# Patient Record
Sex: Female | Born: 1960 | Race: White | Hispanic: No | Marital: Married | Smoking: Former smoker
Health system: Southern US, Community
[De-identification: ages and names within clinical notes are randomized; demographics above are authoritative.]

## PROBLEM LIST (undated history)

## (undated) DIAGNOSIS — R2242 Localized swelling, mass and lump, left lower limb: Secondary | ICD-10-CM

## (undated) DIAGNOSIS — Z800 Family history of malignant neoplasm of digestive organs: Secondary | ICD-10-CM

## (undated) DIAGNOSIS — M1990 Unspecified osteoarthritis, unspecified site: Secondary | ICD-10-CM

## (undated) DIAGNOSIS — N3281 Overactive bladder: Secondary | ICD-10-CM

## (undated) HISTORY — PX: BUNIONECTOMY: SHX129

## (undated) HISTORY — PX: FOOT SURGERY: SHX648

## (undated) HISTORY — DX: Family history of malignant neoplasm of digestive organs: Z80.0

## (undated) HISTORY — PX: APPENDECTOMY: SHX54

## (undated) HISTORY — PX: CHOLECYSTECTOMY: SHX55

## (undated) HISTORY — DX: Unspecified osteoarthritis, unspecified site: M19.90

## (undated) HISTORY — PX: BREAST REDUCTION SURGERY: SHX8

## (undated) HISTORY — PX: ABDOMINAL HYSTERECTOMY: SHX81

---

## 2002-02-05 ENCOUNTER — Ambulatory Visit (HOSPITAL_BASED_OUTPATIENT_CLINIC_OR_DEPARTMENT_OTHER): Admission: RE | Admit: 2002-02-05 | Discharge: 2002-02-06 | Payer: Self-pay | Admitting: Specialist

## 2005-05-27 ENCOUNTER — Ambulatory Visit (HOSPITAL_COMMUNITY): Admission: RE | Admit: 2005-05-27 | Discharge: 2005-05-27 | Payer: Self-pay | Admitting: Family Medicine

## 2005-06-03 ENCOUNTER — Ambulatory Visit: Payer: Self-pay | Admitting: Internal Medicine

## 2005-06-04 ENCOUNTER — Ambulatory Visit (HOSPITAL_COMMUNITY): Admission: RE | Admit: 2005-06-04 | Discharge: 2005-06-04 | Payer: Self-pay | Admitting: Internal Medicine

## 2005-06-08 ENCOUNTER — Emergency Department (HOSPITAL_COMMUNITY): Admission: EM | Admit: 2005-06-08 | Discharge: 2005-06-08 | Payer: Self-pay | Admitting: Emergency Medicine

## 2005-06-10 ENCOUNTER — Ambulatory Visit (HOSPITAL_COMMUNITY): Admission: RE | Admit: 2005-06-10 | Discharge: 2005-06-10 | Payer: Self-pay | Admitting: Internal Medicine

## 2005-06-10 ENCOUNTER — Ambulatory Visit: Payer: Self-pay | Admitting: Internal Medicine

## 2005-06-11 ENCOUNTER — Encounter (HOSPITAL_COMMUNITY): Admission: RE | Admit: 2005-06-11 | Discharge: 2005-06-11 | Payer: Self-pay | Admitting: Internal Medicine

## 2005-07-05 ENCOUNTER — Ambulatory Visit (HOSPITAL_COMMUNITY): Admission: RE | Admit: 2005-07-05 | Discharge: 2005-07-05 | Payer: Self-pay | Admitting: General Surgery

## 2006-04-21 ENCOUNTER — Ambulatory Visit: Payer: Self-pay | Admitting: Internal Medicine

## 2006-04-25 ENCOUNTER — Ambulatory Visit (HOSPITAL_COMMUNITY): Admission: RE | Admit: 2006-04-25 | Discharge: 2006-04-25 | Payer: Self-pay | Admitting: Internal Medicine

## 2006-04-25 ENCOUNTER — Ambulatory Visit: Payer: Self-pay | Admitting: Internal Medicine

## 2009-06-28 HISTORY — PX: OOPHORECTOMY: SHX86

## 2011-02-11 ENCOUNTER — Ambulatory Visit (INDEPENDENT_AMBULATORY_CARE_PROVIDER_SITE_OTHER): Payer: 59 | Admitting: Internal Medicine

## 2011-02-11 ENCOUNTER — Encounter (INDEPENDENT_AMBULATORY_CARE_PROVIDER_SITE_OTHER): Payer: Self-pay | Admitting: Internal Medicine

## 2011-02-11 VITALS — BP 110/70 | HR 80 | Temp 97.8°F | Ht 63.0 in | Wt 190.9 lb

## 2011-02-11 DIAGNOSIS — K625 Hemorrhage of anus and rectum: Secondary | ICD-10-CM

## 2011-02-11 MED ORDER — HYDROCORTISONE ACETATE 25 MG RE SUPP: 25.0000 mg | Freq: Two times a day (BID) | RECTAL | Status: AC

## 2011-02-11 NOTE — Progress Notes (Signed)
Subjective:     Patient ID: Eileen Arnold, female   DOB: 1961-01-14, 50 y.o.   MRN: [id number]  HPI  Eileen Arnold is a 50 yr old female presenting today with c/o rectal bleeding. Started last Wednesday. She was hurting left lower quadrant.  She says it felt like it was her cyst on her ovary.  She bled for 4 days in very small amounts.  She says it was bright red.  Everytime she had a BM she would see bright red blood. Blood was on the stool. Stools are not firm.  They are not pencil thin. No rectal bleeding since Sunday. She underwent a colonoscopy with polypectomy in May of 2011.Biopsy: Hyperplastic polyp. No evidence of endoscopic colitis in random biopsies taken from sigmoid colon.looking for microscopic colitis. Two polyps snared. Will find path report.  Appetite remains good. No weight loss.  No abdominal pain. No recent bright red rectal bleeding.  There is a family history of colon cancer in a first degree relative.  Review of Systems     Objective:   Physical Exam Alert and oriented. Skin warm and dry. Oral mucosa is moist. Natural teeth in good condition. Sclera anicteric, conjunctivae is pink. Thyroid not enlarged. No cervical lymphadenopathy. Lungs clear. Heart regular rate and rhythm.  Abdomen is soft. Bowel sounds are positive. No hepatomegaly. No abdominal masses felt. No tenderness. Stool brown and guaiac negative..  No edema to lower extremities. Patient is alert and oriented.     Assessment:    Rectal bleeding. Family history of colon cancer. Rectal bleeding has resolved at this time.  I discussed this case with Dr. Karilyn Cota.  His recommendations as follows under plan and I agree.    Plan:     Anusol supp x 6 days. OV in 1 month. If any problems she will call office. Patient was reassured.

## 2011-03-18 ENCOUNTER — Ambulatory Visit (INDEPENDENT_AMBULATORY_CARE_PROVIDER_SITE_OTHER): Payer: 59 | Admitting: Internal Medicine

## 2011-06-02 ENCOUNTER — Encounter (INDEPENDENT_AMBULATORY_CARE_PROVIDER_SITE_OTHER): Payer: Self-pay | Admitting: *Deleted

## 2013-03-29 ENCOUNTER — Ambulatory Visit (INDEPENDENT_AMBULATORY_CARE_PROVIDER_SITE_OTHER): Payer: 59 | Admitting: Nurse Practitioner

## 2013-03-29 ENCOUNTER — Encounter: Payer: Self-pay | Admitting: Nurse Practitioner

## 2013-03-29 VITALS — BP 145/85 | HR 85 | Temp 97.0°F | Ht 63.0 in | Wt 238.0 lb

## 2013-03-29 DIAGNOSIS — M79609 Pain in unspecified limb: Secondary | ICD-10-CM

## 2013-03-29 DIAGNOSIS — Z23 Encounter for immunization: Secondary | ICD-10-CM

## 2013-03-29 DIAGNOSIS — M79604 Pain in right leg: Secondary | ICD-10-CM

## 2013-03-29 MED ORDER — NAPROXEN 500 MG PO TABS: 500.0000 mg | ORAL_TABLET | Freq: Two times a day (BID) | ORAL | Status: DC

## 2013-03-29 NOTE — Progress Notes (Signed)
  Subjective:    Patient ID: Eileen Arnold, female    DOB: [birth year], 52 y.o.   MRN: [id number]  HPI Patient in c/o right leg pain- hurts on the lateral side of lower leg- is worse at night when she is laying down. Denies injury. No edema- no pain with walking. Ibuprofen helps some at bedtime- she says that the pain is a throbbing type of pain.    Review of Systems  All other systems reviewed and are negative.       Objective:   Physical Exam  Constitutional: She is oriented to person, place, and time. She appears well-developed and well-nourished.  Cardiovascular: Normal rate, regular rhythm, normal heart sounds and intact distal pulses.   Pulmonary/Chest: Effort normal and breath sounds normal.  Musculoskeletal:  No lower ext edema No point tenderness along right lateral lower ext Neg homan sign Palpable pedal pulses 2+ bil  Neurological: She is alert and oriented to person, place, and time.    BP 145/85  Pulse 85  Temp(Src) 97 F (36.1 C) (Oral)  Ht 5\' 3"  (1.6 m)  Wt 238 lb (107.956 kg)  BMI 42.17 kg/m2       Assessment & Plan:   1. Lower extremity pain, lateral, right    Meds ordered this encounter  Medications  . naproxen (NAPROSYN) 500 MG tablet    Sig: Take 1 tablet (500 mg total) by mouth 2 (two) times daily with a meal.    Dispense:  60 tablet    Refill:  1    Order Specific Question:  Supervising Provider    Answer:  Ernestina Penna [[id number]]   Elevate leg when sitting Keeps legs really warm at night Labs pending Take meds with food If no better in 1 week may do doppler study- although I really don't feel this is clot

## 2013-03-29 NOTE — Patient Instructions (Addendum)
Deep Vein Thrombosis A deep vein thrombosis (DVT) is a blood clot that develops in a deep vein. A DVT is a clot in the deep, larger veins of the leg, arm, or pelvis. These are more dangerous than clots that might form in veins near the surface of the body. A DVT can lead to complications if the clot breaks off and travels in the bloodstream to the lungs.  A DVT can damage the valves in your leg veins, so that instead of flowing upwards, the blood pools in the lower leg. This is called post-thrombotic syndrome, and can result in pain, swelling, discoloration, and sores on the leg. Once identified, a DVT can be treated. It can also be prevented in some circumstances. Once you have had a DVT, you may be at increased risk for a DVT in the future. CAUSES Blood clots form in a vein for different reasons. Usually several things contribute to blood clots. Contributing factors include:  The flow of blood slows down.  The inside of the vein is damaged in some way.  The person has a condition that makes blood clot more easily. Some people are more likely than others to develop blood clots. That is because they have more factors that make clots likely. These are called risk factors. Risk factors include:   Older age, especially over 75 years old.  Having a history of blood clots. This means you have had one before. Or, it means that someone else in your family has had blood clots. You may have a genetic tendency to form clots.  Having major or lengthy surgery. This is especially true for surgery on the hip, knee, or belly (abdomen). Hip surgery is particularly high risk.  Breaking a hip or leg.  Sitting or lying still for a long time. This includes long distance travel, paralysis, or recovery from an illness or surgery.  Cancer, or cancer treatment.  Having a long, thin tube (catheter) placed inside a vein during a medical procedure.  Being overweight (obese).  Pregnancy and childbirth. Hormone  changes make the blood clot more easily during pregnancy. The fetus puts pressure on the veins of the pelvis. There is also risk of injury to veins during delivery or a caesarean. The risk is at its highest just after childbirth.  Medicines with the female hormone estrogen. This includes birth control pills and hormone replacement therapy.  Smoking.  Other circulation or heart problems. SYMPTOMS When a clot forms, it can either partially or totally block the blood flow in that vein. Symptoms of a DVT can include:  Swelling of the leg or arm, especially if one side is much worse.  Warmth and redness of the leg or arm, especially if one side is much worse.  Pain in an arm or leg. If the clot is in the leg, symptoms may be more noticeable or worse when standing or walking. The symptoms of a DVT that has traveled to the lungs (pulmonary embolism, PE) usually start suddenly, and include:  Shortness of breath.  Coughing.  Coughing up blood or blood-tinged phlegm.  Chest pain. The chest pain is often worse with deep breaths.  Rapid heartbeat. Anyone with these symptoms should get emergency medical treatment right away. Call your local emergency services (911 in [place] if you have these symptoms. DIAGNOSIS If a DVT is suspected, your caregiver will take a full medical history and carry out a physical exam. Tests that also may be required include:  Blood tests, including studies of   the clotting properties of the blood.  Ultrasonography to see if you have clots in your legs or lungs.  X-rays to show the flow of blood when dye is injected into the veins (venography).  Studies of your lungs, if you have any chest symptoms. PREVENTION  Exercise the legs regularly. Take a brisk 30 minute walk every day.  Maintain a weight that is appropriate for your height.  Avoid sitting or lying in bed for long periods of time without moving your legs.  Women, particularly those over the age of 35,  should consider the risks and benefits of taking estrogen medicines, including birth control pills.  Do not smoke, especially if you take estrogen medicines.  Long distance travel can increase your risk of DVT. You should exercise your legs by walking or pumping the muscles every hour.  In-hospital prevention:  Many of the risk factors above relate to situations that exist with hospitalization, either for illness, injury, or elective surgery.  Your caregiver will assess you for the need for venous thromboembolism prophylaxis when you are admitted to the hospital. If you are having surgery, your surgeon will assess you the day of or day after surgery.  Prevention may include medical and nonmedical measures. TREATMENT Treatment for DVT helps prevent death and disability. The most common treatment for DVT is blood thinning (anticoagulant) medicine, which reduces the blood's tendency to clot. Anticoagulants can stop new blood clots from forming and old ones from growing. They cannot dissolve existing clots. Your body does this by itself over time. Anticoagulants can be given by mouth, by intravenous (IV) access, or by injection. Your caregiver will determine the best program for you.  Heparin or related medicines (low molecular weight heparin) are usually the first treatment for a blood clot. They act quickly. However, they cannot be taken orally.  Heparin can cause a fall in a component of blood that stops bleeding and forms blood clots (platelets). You will be monitored with blood tests to be sure this does not occur.  Warfarin is an anticoagulant that can be swallowed (taken orally). It takes a few days to start working, so usually heparin or related medicines are used in combination. Once warfarin is working, heparin is usually stopped.  Less commonly, clot dissolving drugs (thrombolytics) are used to dissolve a DVT. They carry a high risk of bleeding, so they are used mainly in severe cases,  where a life or limb is threatened.  Very rarely, a blood clot in the leg needs to be removed surgically.  If you are unable to take anticoagulants, your caregiver may arrange for you to have a filter placed in a main vein in your belly (abdomen). This filter prevents clots from traveling to your lungs. HOME CARE INSTRUCTIONS  Take all medicines prescribed by your caregiver. Follow the directions carefully.  Warfarin. Most people will continue taking warfarin after hospital discharge. Your caregiver will advise you on the length of treatment (usually 3 6 months, sometimes lifelong).  Too much and too little warfarin are both dangerous. Too much warfarin increases the risk of bleeding. Too little warfarin continues to allow the risk for blood clots. While taking warfarin, you will need to have regular blood tests to measure your blood clotting time. These blood tests usually include both the prothrombin time (PT) and international normalized ratio (INR) tests. The PT and INR results allow your caregiver to adjust your dose of warfarin. The dose can change for many reasons. It is critically important that   you take warfarin exactly as prescribed, and that you have your PT and INR levels drawn exactly as directed.  Many foods, especially foods high in vitamin K can interfere with warfarin and affect the PT and INR results. Foods high in vitamin K include spinach, kale, broccoli, cabbage, collard and turnip greens, brussels sprouts, peas, cauliflower, seaweed, and parsley as well as beef and pork liver, green tea, and soybean oil. You should eat a consistent amount of foods high in vitamin K. Avoid major changes in your diet, or notify your caregiver before changing your diet. Arrange a visit with a dietitian to answer your questions.  Many medicines can interfere with warfarin and affect the PT and INR results. You must tell your caregiver about any and all medicines you take, this includes all vitamins  and supplements. Be especially cautious with aspirin and anti-inflammatory medicines. Ask your caregiver before taking these. Do not take or discontinue any prescribed or over-the-counter medicine except on the advice of your caregiver or pharmacist.  Warfarin can have side effects, primarily excessive bruising or bleeding. You will need to hold pressure over cuts for longer than usual. Your caregiver or pharmacist will discuss other potential side effects.  Alcohol can change the body's ability to handle warfarin. It is best to avoid alcoholic drinks or consume only very small amounts while taking warfarin. Notify your caregiver if you change your alcohol intake.  Notify your dentist or other caregivers before procedures.  Activity. Ask your caregiver how soon you can go back to normal activities. It is important to stay active to prevent blood clots. If you are on anticoagulant medicine, avoid contact sports.  Exercise. It is very important to exercise. This is especially important while traveling, sitting or standing for long periods of time. Exercise your legs by walking or by pumping the muscles frequently. Take frequent walks.  Compression stockings. These are tight elastic stockings that apply pressure to the lower legs. This pressure can help keep the blood in the legs from clotting. You may need to wear compressions stockings at home to help prevent a DVT.  Smoking. If you smoke, quit. Ask your caregiver for help with quitting smoking.  Learn as much as you can about DVT. Knowing more about the condition should help you keep it from coming back.  Wear a medical alert bracelet or carry a medical alert card. SEEK MEDICAL CARE IF:  You notice a rapid heartbeat.  You feel weaker or more tired than usual.  You feel faint.  You notice increased bruising.  You feel your symptoms are not getting better in the time expected.  You believe you are having side effects of medicine. SEEK  IMMEDIATE MEDICAL CARE IF:  You have chest pain.  You have trouble breathing.  You have new or increased swelling or pain in one leg.  You cough up blood.  You notice blood in vomit, in a bowel movement, or in urine. MAKE SURE YOU:  Understand these instructions.  Will watch your condition.  Will get help right away if you are not doing well or get worse. Document Released: 06/14/2005 Document Revised: 03/08/2012 Document Reviewed: 08/06/2010 [employer] Patient Information 2014 [employer].  

## 2013-03-30 LAB — CMP14+EGFR
ALT: 19 IU/L (ref 0–32)
AST: 21 IU/L (ref 0–40)
Albumin/Globulin Ratio: 2 (ref 1.1–2.5)
Albumin: 4.2 g/dL (ref 3.5–5.5)
BUN/Creatinine Ratio: 19 (ref 9–23)
BUN: 15 mg/dL (ref 6–24)
CO2: 29 mmol/L (ref 18–29)
Calcium: 9.7 mg/dL (ref 8.7–10.2)
Chloride: 101 mmol/L (ref 97–108)
Creatinine, Ser: 0.77 mg/dL (ref 0.57–1.00)
GFR calc Af Amer: 103 mL/min/{1.73_m2} (ref >59)
GFR calc non Af Amer: 90 mL/min/{1.73_m2} (ref >59)
Globulin, Total: 2.1 g/dL (ref 1.5–4.5)
Glucose: 96 mg/dL (ref 65–99)
Potassium: 4.7 mmol/L (ref 3.5–5.2)
Sodium: 144 mmol/L (ref 134–144)
Total Bilirubin: 0.3 mg/dL (ref 0.0–1.2)
Total Protein: 6.3 g/dL (ref 6.0–8.5)

## 2013-04-16 ENCOUNTER — Ambulatory Visit (INDEPENDENT_AMBULATORY_CARE_PROVIDER_SITE_OTHER): Payer: 59

## 2013-04-16 ENCOUNTER — Ambulatory Visit (INDEPENDENT_AMBULATORY_CARE_PROVIDER_SITE_OTHER): Payer: 59 | Admitting: Nurse Practitioner

## 2013-04-16 ENCOUNTER — Encounter: Payer: Self-pay | Admitting: Nurse Practitioner

## 2013-04-16 DIAGNOSIS — M79609 Pain in unspecified limb: Secondary | ICD-10-CM

## 2013-04-16 DIAGNOSIS — M549 Dorsalgia, unspecified: Secondary | ICD-10-CM

## 2013-04-16 MED ORDER — PREDNISONE 20 MG PO TABS: ORAL_TABLET | ORAL | Status: DC

## 2013-04-16 MED ORDER — METHYLPREDNISOLONE ACETATE 80 MG/ML IJ SUSP
80.0000 mg | Freq: Once | INTRAMUSCULAR | Status: AC
  Administered 2013-04-16: 80 mg via INTRAMUSCULAR

## 2013-04-16 NOTE — Patient Instructions (Signed)
Sciatica °Sciatica is pain, weakness, numbness, or tingling along the path of the sciatic nerve. The nerve starts in the lower back and runs down the back of each leg. The nerve controls the muscles in the lower leg and in the back of the knee, while also providing sensation to the back of the thigh, lower leg, and the sole of your foot. Sciatica is a symptom of another medical condition. For instance, nerve damage or certain conditions, such as a herniated disk or bone spur on the spine, pinch or put pressure on the sciatic nerve. This causes the pain, weakness, or other sensations normally associated with sciatica. Generally, sciatica only affects one side of the body. °CAUSES  °· Herniated or slipped disc. °· Degenerative disk disease. °· A pain disorder involving the narrow muscle in the buttocks (piriformis syndrome). °· Pelvic injury or fracture. °· Pregnancy. °· Tumor (rare). °SYMPTOMS  °Symptoms can vary from mild to very severe. The symptoms usually travel from the low back to the buttocks and down the back of the leg. Symptoms can include: °· Mild tingling or dull aches in the lower back, leg, or hip. °· Numbness in the back of the calf or sole of the foot. °· Burning sensations in the lower back, leg, or hip. °· Sharp pains in the lower back, leg, or hip. °· Leg weakness. °· Severe back pain inhibiting movement. °These symptoms may get worse with coughing, sneezing, laughing, or prolonged sitting or standing. Also, being overweight may worsen symptoms. °DIAGNOSIS  °Your caregiver will perform a physical exam to look for common symptoms of sciatica. He or she may ask you to do certain movements or activities that would trigger sciatic nerve pain. Other tests may be performed to find the cause of the sciatica. These may include: °· Blood tests. °· X-rays. °· Imaging tests, such as an MRI or CT scan. °TREATMENT  °Treatment is directed at the cause of the sciatic pain. Sometimes, treatment is not necessary  and the pain and discomfort goes away on its own. If treatment is needed, your caregiver may suggest: °· Over-the-counter medicines to relieve pain. °· Prescription medicines, such as anti-inflammatory medicine, muscle relaxants, or narcotics. °· Applying heat or ice to the painful area. °· Steroid injections to lessen pain, irritation, and inflammation around the nerve. °· Reducing activity during periods of pain. °· Exercising and stretching to strengthen your abdomen and improve flexibility of your spine. Your caregiver may suggest losing weight if the extra weight makes the back pain worse. °· Physical therapy. °· Surgery to eliminate what is pressing or pinching the nerve, such as a bone spur or part of a herniated disk. °HOME CARE INSTRUCTIONS  °· Only take over-the-counter or prescription medicines for pain or discomfort as directed by your caregiver. °· Apply ice to the affected area for 20 minutes, 3 4 times a day for the first 48 72 hours. Then try heat in the same way. °· Exercise, stretch, or perform your usual activities if these do not aggravate your pain. °· Attend physical therapy sessions as directed by your caregiver. °· Keep all follow-up appointments as directed by your caregiver. °· Do not wear high heels or shoes that do not provide proper support. °· Check your mattress to see if it is too soft. A firm mattress may lessen your pain and discomfort. °SEEK IMMEDIATE MEDICAL CARE IF:  °· You lose control of your bowel or bladder (incontinence). °· You have increasing weakness in the lower back,   pelvis, buttocks, or legs. °· You have redness or swelling of your back. °· You have a burning sensation when you urinate. °· You have pain that gets worse when you lie down or awakens you at night. °· Your pain is worse than you have experienced in the past. °· Your pain is lasting longer than 4 weeks. °· You are suddenly losing weight without reason. °MAKE SURE YOU: °· Understand these  instructions. °· Will watch your condition. °· Will get help right away if you are not doing well or get worse. °Document Released: 06/08/2001 Document Revised: 12/14/2011 Document Reviewed: 10/24/2011 °[employer]® Patient Information ©2014 [employer]. ° °

## 2013-04-16 NOTE — Progress Notes (Signed)
  Subjective:    Patient ID: Eileen Arnold, female    DOB: Jan 21, 1961, 52 y.o.   MRN: [id number]  HPI  Patient in C/o right leg pain- started several months ago an d is getting worse- Pain starts in right anterior lower leg and radiates upward- Hurts worse at night- no pain with walking    Review of Systems  All other systems reviewed and are negative.       Objective:   Physical Exam  Constitutional: She appears well-developed and well-nourished.  Cardiovascular: Normal rate, regular rhythm and normal heart sounds.   Pulmonary/Chest: Effort normal and breath sounds normal.  Musculoskeletal:  Pain on palpation right lateral shin- no calf swelling on tenderness   Right lower leg x ray- negative-Preliminary reading by Paulene Floor, FNP  [facility] BP 144/83  Pulse 97  Temp(Src) 97.4 F (36.3 C) (Oral)  Ht 5\' 3"  (1.6 m)  Wt 244 lb (110.678 kg)  BMI 43.23 kg/m2         Assessment & Plan:   1. Lower leg pain, right   2. Back pain    Meds ordered this encounter  Medications  . methylPREDNISolone acetate (DEPO-MEDROL) injection 80 mg    Sig:   . predniSONE (DELTASONE) 20 MG tablet    Sig: 2 tablets at same time daily for 5 days- start tomorrow    Dispense:  10 tablet    Refill:  0    Order Specific Question:  Supervising Provider    Answer:  Ernestina Penna [[id number]]   Moist heat to back- No heavy lifting RTO if not improving and we will order MRI  Mary-Margaret Daphine Deutscher, FNP

## 2013-04-23 ENCOUNTER — Other Ambulatory Visit: Payer: Self-pay | Admitting: Nurse Practitioner

## 2013-04-23 ENCOUNTER — Telehealth: Payer: Self-pay

## 2013-04-23 DIAGNOSIS — M545 Low back pain: Secondary | ICD-10-CM

## 2013-04-23 NOTE — Telephone Encounter (Signed)
Patient aware referrla made for MRI

## 2013-04-23 NOTE — Telephone Encounter (Signed)
Pt called stating she had completed medicine with no change in pain level. Pain radiating down right leg; worse at night when trying to rest. Can we schedule MRI? Pt's insurance does not require Precertification. She wants this done at [facility]

## 2013-05-02 ENCOUNTER — Other Ambulatory Visit: Payer: Self-pay | Admitting: Nurse Practitioner

## 2013-05-02 DIAGNOSIS — M48061 Spinal stenosis, lumbar region without neurogenic claudication: Secondary | ICD-10-CM

## 2014-01-01 ENCOUNTER — Telehealth: Payer: Self-pay

## 2014-01-01 NOTE — Telephone Encounter (Signed)
Patient has not been seen since last October- ins will not approve MRI without being seen.

## 2014-01-02 NOTE — Telephone Encounter (Signed)
Patient aware appointment made 7/9 per mmm

## 2014-01-03 ENCOUNTER — Ambulatory Visit (INDEPENDENT_AMBULATORY_CARE_PROVIDER_SITE_OTHER): Payer: 59 | Admitting: Nurse Practitioner

## 2014-01-03 ENCOUNTER — Encounter: Payer: Self-pay | Admitting: Nurse Practitioner

## 2014-01-03 ENCOUNTER — Ambulatory Visit (INDEPENDENT_AMBULATORY_CARE_PROVIDER_SITE_OTHER): Payer: 59

## 2014-01-03 VITALS — BP 150/70 | HR 99 | Temp 98.3°F | Ht 63.0 in | Wt 251.2 lb

## 2014-01-03 DIAGNOSIS — M25569 Pain in unspecified knee: Secondary | ICD-10-CM

## 2014-01-03 DIAGNOSIS — M25561 Pain in right knee: Secondary | ICD-10-CM

## 2014-01-03 MED ORDER — KETOROLAC TROMETHAMINE 60 MG/2ML IM SOLN
60.0000 mg | Freq: Once | INTRAMUSCULAR | Status: AC
Start: 2014-01-03 — End: 2014-01-03
  Administered 2014-01-03: 60 mg via INTRAMUSCULAR

## 2014-01-03 MED ORDER — CELECOXIB 200 MG PO CAPS: 200.0000 mg | ORAL_CAPSULE | Freq: Two times a day (BID) | ORAL | Status: DC

## 2014-01-03 NOTE — Patient Instructions (Signed)

## 2014-01-03 NOTE — Progress Notes (Signed)
   Subjective:    Patient ID: Eileen Arnold, female    DOB: [birth year], 53 y.o.   MRN: [id number]  Knee Pain  The incident occurred more than 1 week ago (patient is a [employer]. ). There was no injury mechanism. The pain is present in the right leg. The quality of the pain is described as aching. The pain is at a severity of 6/10. The pain is moderate. The pain has been constant since onset. Associated symptoms include numbness (ankle. ). Pertinent negatives include no inability to bear weight or tingling. She reports no foreign bodies present. The symptoms are aggravated by movement. She has tried NSAIDs for the symptoms. The treatment provided no relief.      Review of Systems  Neurological: Positive for numbness (ankle. ). Negative for tingling.  All other systems reviewed and are negative.      Objective:   Physical Exam  Constitutional: She is oriented to person, place, and time. She appears well-developed and well-nourished.  HENT:  Head: Normocephalic.  Right Ear: Hearing, tympanic membrane, external ear and ear canal normal.  Left Ear: Hearing, tympanic membrane, external ear and ear canal normal.  Nose: Nose normal.  Mouth/Throat: Uvula is midline, oropharynx is clear and moist and mucous membranes are normal.  Eyes: Conjunctivae and EOM are normal. Pupils are equal, round, and reactive to light.  Neck: Normal range of motion. Neck supple. No JVD present. No thyromegaly present.  Cardiovascular: Normal rate, normal heart sounds and intact distal pulses.   No murmur heard. Pulmonary/Chest: Effort normal and breath sounds normal. She has no wheezes. She has no rales.  Abdominal: Soft. Bowel sounds are normal. She exhibits no mass.  Musculoskeletal: She exhibits tenderness.  Pain with internal rotation, right knee.   Neurological: She is alert and oriented to person, place, and time. She has normal reflexes.  Skin: Skin is warm and dry.  Psychiatric: She has a normal mood  and affect. Her behavior is normal. Judgment and thought content normal.    Right knee xray- wnl- bakers cyst posterior knee-Preliminary reading by Ronnald Collum, FNP  [facility]       Assessment & Plan:   1. Right knee pain    Orders Placed This Encounter  Procedures  . DG Knee 1-2 Views Right    Standing Status: Future     Number of Occurrences: 1     Standing Expiration Date: 03/05/2015    Order Specific Question:  Reason for Exam (SYMPTOM  OR DIAGNOSIS REQUIRED)    Answer:  right knee pain.    Order Specific Question:  Is the patient pregnant?    Answer:  No    Order Specific Question:  Preferred imaging location?    Answer:  Internal   Meds ordered this encounter  Medications  . Fish Oil-Cholecalciferol (FISH OIL + D3 PO)    Sig: Take by mouth.  . Cetirizine HCl (ZYRTEC PO)    Sig: Take by mouth. OTC  . celecoxib (CELEBREX) 200 MG capsule    Sig: Take 1 capsule (200 mg total) by mouth 2 (two) times daily.    Dispense:  60 capsule    Refill:  2    Order Specific Question:  Supervising Provider    Answer:  Chipper Herb [[id number]]  . ketorolac (TORADOL) injection 60 mg    Sig:    Moist heat Rest Elastic bandage  Mary-Margaret Hassell Done, FNP

## 2014-04-01 ENCOUNTER — Other Ambulatory Visit: Payer: Self-pay | Admitting: Nurse Practitioner

## 2014-04-04 ENCOUNTER — Ambulatory Visit (INDEPENDENT_AMBULATORY_CARE_PROVIDER_SITE_OTHER): Payer: 59

## 2014-04-04 DIAGNOSIS — Z23 Encounter for immunization: Secondary | ICD-10-CM

## 2014-05-05 ENCOUNTER — Other Ambulatory Visit: Payer: Self-pay | Admitting: Nurse Practitioner

## 2014-05-06 NOTE — Telephone Encounter (Signed)
Last ov 7/15. 

## 2014-06-02 ENCOUNTER — Other Ambulatory Visit: Payer: Self-pay | Admitting: Nurse Practitioner

## 2014-06-18 ENCOUNTER — Ambulatory Visit (INDEPENDENT_AMBULATORY_CARE_PROVIDER_SITE_OTHER): Payer: 59 | Admitting: Nurse Practitioner

## 2014-06-18 ENCOUNTER — Encounter: Payer: Self-pay | Admitting: Nurse Practitioner

## 2014-06-18 VITALS — BP 146/90 | HR 70 | Temp 97.0°F | Ht 63.0 in | Wt 257.0 lb

## 2014-06-18 DIAGNOSIS — J208 Acute bronchitis due to other specified organisms: Secondary | ICD-10-CM

## 2014-06-18 MED ORDER — METHYLPREDNISOLONE ACETATE 80 MG/ML IJ SUSP
80.0000 mg | Freq: Once | INTRAMUSCULAR | Status: AC
  Administered 2014-06-18: 80 mg via INTRAMUSCULAR

## 2014-06-18 MED ORDER — HYDROCODONE-HOMATROPINE 5-1.5 MG/5ML PO SYRP: 5.0000 mL | ORAL_SOLUTION | Freq: Three times a day (TID) | ORAL | Status: DC | PRN

## 2014-06-18 MED ORDER — PREDNISONE 20 MG PO TABS: ORAL_TABLET | ORAL | Status: DC

## 2014-06-18 NOTE — Progress Notes (Signed)
   Subjective:    Patient ID: Eileen Arnold, female    DOB: 21-Jul-1960, 53 y.o.   MRN: [id number]  HPI Patient in c/o of nasal congestion and cough that started Last Friday- OTC meds no help. Lost her voice last week but that has improved.    Review of Systems  Constitutional: Negative for fever, chills and appetite change.  HENT: Positive for congestion, rhinorrhea, sinus pressure and voice change. Negative for sore throat and trouble swallowing.   Respiratory: Positive for cough.   Cardiovascular: Negative.   Gastrointestinal: Negative.   Neurological: Negative.   Psychiatric/Behavioral: Negative.   All other systems reviewed and are negative.      Objective:   Physical Exam  Constitutional: She is oriented to person, place, and time. She appears well-developed and well-nourished. No distress.  HENT:  Right Ear: Hearing, tympanic membrane, external ear and ear canal normal.  Left Ear: Hearing, tympanic membrane, external ear and ear canal normal.  Nose: Mucosal edema and rhinorrhea present. Right sinus exhibits no maxillary sinus tenderness and no frontal sinus tenderness. Left sinus exhibits no maxillary sinus tenderness and no frontal sinus tenderness.  Mouth/Throat: Uvula is midline, oropharynx is clear and moist and mucous membranes are normal.  Eyes: Pupils are equal, round, and reactive to light.  Neck: Normal range of motion. Neck supple.  Cardiovascular: Normal rate, regular rhythm and normal heart sounds.   Pulmonary/Chest: Effort normal. She has no wheezes. She has no rales.  Deep dry cough  Abdominal: There is tenderness.  Lymphadenopathy:    She has no cervical adenopathy.  Neurological: She is alert and oriented to person, place, and time.  Skin: Skin is warm.  Psychiatric: She has a normal mood and affect. Her behavior is normal. Judgment and thought content normal.   BP 146/90 mmHg  Pulse 70  Temp(Src) 97 F (36.1 C) (Oral)  Ht 5\' 3"  (1.6 m)  Wt 257 lb  (116.574 kg)  BMI 45.54 kg/m2        Assessment & Plan:  1. Viral bronchitis 1. Take meds as prescribed 2. Use a cool mist humidifier especially during the winter months and when heat has been humid. 3. Use saline nose sprays frequently 4. Saline irrigations of the nose can be very helpful if done frequently.  * 4X daily for 1 week*  * Use of a nettie pot can be helpful with this. Follow directions with this* 5. Drink plenty of fluids 6. Keep thermostat turn down low 7.For any cough or congestion  Use plain Mucinex- regular strength or max strength is fine   * Children- consult with Pharmacist for dosing 8. For fever or aces or pains- take tylenol or ibuprofen appropriate for age and weight.  * for fevers greater than 101 orally you may alternate ibuprofen and tylenol every  3 hours.   - HYDROcodone-homatropine (HYCODAN) 5-1.5 MG/5ML syrup; Take 5 mLs by mouth every 8 (eight) hours as needed for cough.  Dispense: 120 mL; Refill: 0 - predniSONE (DELTASONE) 20 MG tablet; 2 Tablets PO at the same time daily for 5 days- do not start until tomorrow  Dispense: 10 tablet; Refill: 0 - methylPREDNISolone acetate (DEPO-MEDROL) injection 80 mg; Inject 1 mL (80 mg total) into the muscle once.  Mary-Margaret Hassell Done, FNP

## 2014-06-18 NOTE — Patient Instructions (Signed)

## 2014-06-24 ENCOUNTER — Telehealth: Payer: Self-pay

## 2014-06-24 ENCOUNTER — Other Ambulatory Visit: Payer: Self-pay | Admitting: Nurse Practitioner

## 2014-06-24 DIAGNOSIS — J208 Acute bronchitis due to other specified organisms: Secondary | ICD-10-CM

## 2014-06-24 MED ORDER — AZITHROMYCIN 250 MG PO TABS: ORAL_TABLET | ORAL | Status: DC

## 2014-06-24 MED ORDER — HYDROCODONE-HOMATROPINE 5-1.5 MG/5ML PO SYRP: 5.0000 mL | ORAL_SOLUTION | Freq: Three times a day (TID) | ORAL | Status: DC | PRN

## 2014-06-24 NOTE — Telephone Encounter (Signed)
rx for z pak sent to pharmacy- will need to come and pick up hycodan rx.

## 2014-06-24 NOTE — Telephone Encounter (Signed)
Patient aware that antibiotic sent in and rx is ready to be picked up here at the office for the hycodan.

## 2014-06-26 ENCOUNTER — Ambulatory Visit (INDEPENDENT_AMBULATORY_CARE_PROVIDER_SITE_OTHER): Payer: 59

## 2014-06-26 ENCOUNTER — Other Ambulatory Visit: Payer: Self-pay | Admitting: Family Medicine

## 2014-06-26 ENCOUNTER — Ambulatory Visit (INDEPENDENT_AMBULATORY_CARE_PROVIDER_SITE_OTHER): Payer: 59 | Admitting: Family Medicine

## 2014-06-26 VITALS — BP 142/88 | HR 110 | Temp 98.0°F | Ht 63.0 in | Wt 260.2 lb

## 2014-06-26 DIAGNOSIS — J208 Acute bronchitis due to other specified organisms: Secondary | ICD-10-CM

## 2014-06-26 DIAGNOSIS — R05 Cough: Secondary | ICD-10-CM

## 2014-06-26 DIAGNOSIS — R059 Cough, unspecified: Secondary | ICD-10-CM

## 2014-06-26 DIAGNOSIS — J206 Acute bronchitis due to rhinovirus: Secondary | ICD-10-CM

## 2014-06-26 MED ORDER — LEVOFLOXACIN 500 MG PO TABS: 500.0000 mg | ORAL_TABLET | Freq: Every day | ORAL | Status: DC

## 2014-06-26 MED ORDER — ALBUTEROL SULFATE HFA 108 (90 BASE) MCG/ACT IN AERS: 2.0000 | INHALATION_SPRAY | Freq: Four times a day (QID) | RESPIRATORY_TRACT | Status: DC | PRN

## 2014-06-26 MED ORDER — PREDNISONE 10 MG PO TABS
ORAL_TABLET | ORAL | Status: DC
Start: 2014-06-26 — End: 2014-12-13

## 2014-06-26 MED ORDER — LEVALBUTEROL HCL 1.25 MG/0.5ML IN NEBU
1.2500 mg | INHALATION_SOLUTION | Freq: Once | RESPIRATORY_TRACT | Status: AC
  Administered 2014-06-26: 1.25 mg via RESPIRATORY_TRACT

## 2014-06-26 MED ORDER — HYDROCODONE-HOMATROPINE 5-1.5 MG/5ML PO SYRP: 5.0000 mL | ORAL_SOLUTION | Freq: Three times a day (TID) | ORAL | Status: DC | PRN

## 2014-06-26 NOTE — Progress Notes (Signed)
   Subjective:    Patient ID: [name], female    DOB: 06/30/1960, 53 y.o.   MRN: [id number]  HPI Patient is here for follow up on her URI.  She has been sob and coughing.  She has finished abx's and medrol dose pack as directed.  She is coughing and a lot at night.  Review of Systems  Constitutional: Negative for fever.  HENT: Negative for ear pain.   Eyes: Negative for discharge.  Respiratory: Negative for cough.   Cardiovascular: Negative for chest pain.  Gastrointestinal: Negative for abdominal distention.  Endocrine: Negative for polyuria.  Genitourinary: Negative for difficulty urinating.  Musculoskeletal: Negative for gait problem and neck pain.  Skin: Negative for color change and rash.  Neurological: Negative for speech difficulty and headaches.  Psychiatric/Behavioral: Negative for agitation.       Objective:    BP 142/88 mmHg  Pulse 110  Temp(Src) 98 F (36.7 C) (Oral)  Ht 5\' 3"  (1.6 m)  Wt 260 lb 3.2 oz (118.026 kg)  BMI 46.10 kg/m2 Physical Exam  Constitutional: She is oriented to person, place, and time. She appears well-developed and well-nourished.  HENT:  Head: Normocephalic and atraumatic.  Mouth/Throat: Oropharynx is clear and moist.  Eyes: Pupils are equal, round, and reactive to light.  Neck: Normal range of motion. Neck supple.  Cardiovascular: Normal rate and regular rhythm.   No murmur heard. Pulmonary/Chest: Effort normal and breath sounds normal.  Abdominal: Soft. Bowel sounds are normal. There is no tenderness.  Neurological: She is alert and oriented to person, place, and time.  Skin: Skin is warm and dry.  Psychiatric: She has a normal mood and affect.          Assessment & Plan:     ICD-9-CM ICD-10-CM   1. Viral bronchitis 466.0 J20.8 levofloxacin (LEVAQUIN) 500 MG tablet     predniSONE (DELTASONE) 10 MG tablet     HYDROcodone-homatropine (HYCODAN) 5-1.5 MG/5ML syrup     albuterol (PROVENTIL HFA;VENTOLIN HFA) 108 (90 BASE)  MCG/ACT inhaler     levalbuterol (XOPENEX) nebulizer solution 1.25 mg  2. Acute bronchitis due to Rhinovirus 466.0 J20.6 levofloxacin (LEVAQUIN) 500 MG tablet   079.3  predniSONE (DELTASONE) 10 MG tablet     HYDROcodone-homatropine (HYCODAN) 5-1.5 MG/5ML syrup     albuterol (PROVENTIL HFA;VENTOLIN HFA) 108 (90 BASE) MCG/ACT inhaler     levalbuterol (XOPENEX) nebulizer solution 1.25 mg     No Follow-up on file.  Lysbeth Penner FNP

## 2014-06-27 ENCOUNTER — Other Ambulatory Visit: Payer: Self-pay | Admitting: *Deleted

## 2014-06-27 MED ORDER — CELECOXIB 200 MG PO CAPS: 200.0000 mg | ORAL_CAPSULE | Freq: Two times a day (BID) | ORAL | Status: DC

## 2014-06-27 NOTE — Telephone Encounter (Signed)
Pt requesting refill on celebrex, last seen 06/18/14 with MMM. Rx refilled.

## 2014-07-29 ENCOUNTER — Other Ambulatory Visit: Payer: Self-pay | Admitting: Nurse Practitioner

## 2014-08-25 ENCOUNTER — Other Ambulatory Visit: Payer: Self-pay | Admitting: Nurse Practitioner

## 2014-09-25 ENCOUNTER — Other Ambulatory Visit: Payer: Self-pay | Admitting: Family Medicine

## 2014-10-22 ENCOUNTER — Other Ambulatory Visit: Payer: Self-pay | Admitting: Family Medicine

## 2014-10-29 ENCOUNTER — Encounter (INDEPENDENT_AMBULATORY_CARE_PROVIDER_SITE_OTHER): Payer: Self-pay | Admitting: *Deleted

## 2014-11-01 ENCOUNTER — Encounter: Payer: Self-pay | Admitting: Family Medicine

## 2014-11-14 ENCOUNTER — Telehealth (INDEPENDENT_AMBULATORY_CARE_PROVIDER_SITE_OTHER): Payer: Self-pay | Admitting: *Deleted

## 2014-11-14 ENCOUNTER — Other Ambulatory Visit (INDEPENDENT_AMBULATORY_CARE_PROVIDER_SITE_OTHER): Payer: Self-pay | Admitting: *Deleted

## 2014-11-14 DIAGNOSIS — Z800 Family history of malignant neoplasm of digestive organs: Secondary | ICD-10-CM

## 2014-11-14 DIAGNOSIS — Z1211 Encounter for screening for malignant neoplasm of colon: Secondary | ICD-10-CM

## 2014-11-14 DIAGNOSIS — Z86010 Personal history of colonic polyps: Secondary | ICD-10-CM

## 2014-11-14 NOTE — Telephone Encounter (Signed)
Referring MD/PCP: mary-margaret martin -- [facility]   Procedure: tcs  Reason/Indication:  Hx polyps, fam hx colon ca  Has patient had this procedure before?  Yes, 2011 -- paper chart  If so, when, by whom and where?    Is there a family history of colon cancer?  Yes, sister  Who?  What age when diagnosed?    Is patient diabetic?   no      Does patient have prosthetic heart valve?  no  Do you have a pacemaker?  no  Has patient ever had endocarditis? no  Has patient had joint replacement within last 12 months?  no  Does patient tend to be constipated or take laxatives? no  Is patient on Coumadin, Plavix and/or Aspirin? no  Medications: see epic  Allergies: nkda  Medication Adjustment:   Procedure date & time: 12/13/14 at 830

## 2014-11-14 NOTE — Telephone Encounter (Signed)
Patient needs movi prep 

## 2014-11-21 MED ORDER — PEG-KCL-NACL-NASULF-NA ASC-C 100 G PO SOLR: 1.0000 | Freq: Once | ORAL | Status: DC

## 2014-11-21 NOTE — Telephone Encounter (Signed)
agree

## 2014-11-27 ENCOUNTER — Encounter (INDEPENDENT_AMBULATORY_CARE_PROVIDER_SITE_OTHER): Payer: Self-pay | Admitting: *Deleted

## 2014-12-13 ENCOUNTER — Ambulatory Visit (HOSPITAL_COMMUNITY)
Admission: RE | Admit: 2014-12-13 | Discharge: 2014-12-13 | Disposition: A | Discharge status: Home / Self Care | Payer: 59 | Source: Ambulatory Visit | Attending: Internal Medicine | Admitting: Internal Medicine

## 2014-12-13 ENCOUNTER — Encounter (HOSPITAL_COMMUNITY): Payer: Self-pay | Admitting: *Deleted

## 2014-12-13 ENCOUNTER — Encounter (HOSPITAL_COMMUNITY)
Admission: RE | Disposition: A | Discharge status: Home / Self Care | Payer: Self-pay | Source: Ambulatory Visit | Attending: Internal Medicine

## 2014-12-13 DIAGNOSIS — Z87891 Personal history of nicotine dependence: Secondary | ICD-10-CM | POA: Diagnosis not present

## 2014-12-13 DIAGNOSIS — D123 Benign neoplasm of transverse colon: Secondary | ICD-10-CM | POA: Insufficient documentation

## 2014-12-13 DIAGNOSIS — K5730 Diverticulosis of large intestine without perforation or abscess without bleeding: Secondary | ICD-10-CM

## 2014-12-13 DIAGNOSIS — Z79899 Other long term (current) drug therapy: Secondary | ICD-10-CM | POA: Insufficient documentation

## 2014-12-13 DIAGNOSIS — Z800 Family history of malignant neoplasm of digestive organs: Secondary | ICD-10-CM | POA: Insufficient documentation

## 2014-12-13 DIAGNOSIS — Z09 Encounter for follow-up examination after completed treatment for conditions other than malignant neoplasm: Secondary | ICD-10-CM | POA: Diagnosis present

## 2014-12-13 DIAGNOSIS — K644 Residual hemorrhoidal skin tags: Secondary | ICD-10-CM | POA: Insufficient documentation

## 2014-12-13 DIAGNOSIS — Z9049 Acquired absence of other specified parts of digestive tract: Secondary | ICD-10-CM | POA: Insufficient documentation

## 2014-12-13 DIAGNOSIS — D120 Benign neoplasm of cecum: Secondary | ICD-10-CM | POA: Insufficient documentation

## 2014-12-13 DIAGNOSIS — Z86010 Personal history of colonic polyps: Secondary | ICD-10-CM | POA: Insufficient documentation

## 2014-12-13 DIAGNOSIS — K648 Other hemorrhoids: Secondary | ICD-10-CM | POA: Diagnosis not present

## 2014-12-13 HISTORY — PX: COLONOSCOPY: SHX5424

## 2014-12-13 SURGERY — COLONOSCOPY
Anesthesia: Moderate Sedation

## 2014-12-13 MED ORDER — MEPERIDINE HCL 50 MG/ML IJ SOLN
INTRAMUSCULAR | Status: DC | PRN
  Administered 2014-12-13 (×2): 25 mg via INTRAVENOUS

## 2014-12-13 MED ORDER — MEPERIDINE HCL 50 MG/ML IJ SOLN
INTRAMUSCULAR | Status: AC
  Filled 2014-12-13: qty 1

## 2014-12-13 MED ORDER — MIDAZOLAM HCL 5 MG/5ML IJ SOLN
INTRAMUSCULAR | Status: DC | PRN
  Administered 2014-12-13: 1 mg via INTRAVENOUS
  Administered 2014-12-13 (×3): 2 mg via INTRAVENOUS

## 2014-12-13 MED ORDER — STERILE WATER FOR IRRIGATION IR SOLN
Status: DC | PRN
  Administered 2014-12-13: 10:00:00

## 2014-12-13 MED ORDER — MIDAZOLAM HCL 5 MG/5ML IJ SOLN
INTRAMUSCULAR | Status: AC
  Filled 2014-12-13: qty 10

## 2014-12-13 MED ORDER — SODIUM CHLORIDE 0.9 % IV SOLN
INTRAVENOUS | Status: DC
  Administered 2014-12-13: 1000 mL via INTRAVENOUS

## 2014-12-13 NOTE — H&P (Signed)
Eileen Arnold is an 54 y.o. female.   Chief Complaint: Patient is here for colonoscopy. HPI: Patient is 54 year old Caucasian female was history of colonic polyps and family history of CRC and is here for surveillance colonoscopy. Last colonoscopy was 5 years ago at [facility]. She denies abdominal pain change in bowel habits or rectal bleeding. Her sister was diagnosed with CRC at age 73 and is doing fine 9 years later.  History reviewed. No pertinent past medical history.  Past Surgical History  Procedure Laterality Date  . Appendectomy    . Cholecystectomy    . Abdominal hysterectomy    . Breast reduction surgery Bilateral   . Oophorectomy Right 2011  . Bunionectomy Right     Family History  Problem Relation Age of Onset  . Alzheimer's disease Mother   . Stroke Mother   . Heart disease Father   . Nephrolithiasis Father   . Cancer Sister   . Hypertension Sister    Social History:  reports that she quit smoking about 5 months ago. She does not have any smokeless tobacco history on file. She reports that she drinks alcohol. She reports that she does not use illicit drugs.  Allergies: No Known Allergies  Medications Prior to Admission  Medication Sig Dispense Refill  . acidophilus (RISAQUAD) CAPS capsule Take 1 capsule by mouth daily.    . celecoxib (CELEBREX) 200 MG capsule Take 200 mg by mouth daily.    . Cholecalciferol (VITAMIN D3) 5000 UNITS CAPS Take 1 capsule by mouth daily.    . Fish Oil-Cholecalciferol (FISH OIL + D3 PO) Take 1 capsule by mouth daily.     . Multiple Vitamins-Minerals (HAIR/SKIN/NAILS PO) Take 1 tablet by mouth daily.    . multivitamin (THERAGRAN) per tablet Take 1 tablet by mouth daily.      . peg 3350 powder (MOVIPREP) 100 G SOLR Take 1 kit (200 g total) by mouth once. 1 kit 0  . albuterol (PROVENTIL HFA;VENTOLIN HFA) 108 (90 BASE) MCG/ACT inhaler Inhale 2 puffs into the lungs every 6 (six) hours as needed for wheezing or shortness of breath. 1 Inhaler 0   . azithromycin (ZITHROMAX Z-PAK) 250 MG tablet As directed (Patient not taking: Reported on 12/02/2014) 6 each 0  . celecoxib (CELEBREX) 200 MG capsule TAKE ONE CAPSULE BY MOUTH TWICE A DAY (Patient not taking: Reported on 12/02/2014) 60 capsule 0  . HYDROcodone-homatropine (HYCODAN) 5-1.5 MG/5ML syrup Take 5 mLs by mouth every 8 (eight) hours as needed for cough. (Patient not taking: Reported on 12/02/2014) 120 mL 0  . levofloxacin (LEVAQUIN) 500 MG tablet Take 1 tablet (500 mg total) by mouth daily. (Patient not taking: Reported on 12/02/2014) 10 tablet 0  . predniSONE (DELTASONE) 10 MG tablet 4 po qd x 2 days then 3 po qd x 2 days then 2 po qd x 2 days then 1 po qd x 2days (Patient not taking: Reported on 12/02/2014) 20 tablet 0    No results found for this or any previous visit (from the past 75 hour(s)). No results found.  ROS  Blood pressure 146/75, pulse 77, temperature 97.4 F (36.3 C), temperature source Oral, resp. rate 20, height _0  (1.6 m), weight 260 lb (117.935 kg), SpO2 96 %. Physical Exam  Constitutional:  Well-developed obese Caucasian female in NAD.  HENT:  Mouth/Throat: Oropharynx is clear and moist.  Eyes: Conjunctivae are normal. No scleral icterus.  Neck: No thyromegaly present.  Cardiovascular: Normal rate, regular rhythm and normal heart  sounds.   No murmur heard. Respiratory: Effort normal and breath sounds normal.  GI:  Abdomen is full soft and nontender without organomegaly or masses. Small hernia noted at site of laparoscopy scar below the right costal margin.  Musculoskeletal: She exhibits no edema.  Lymphadenopathy:    She has no cervical adenopathy.  Neurological: She is alert.  Skin: Skin is warm and dry.     Assessment/Plan History of colonic polyps. Family history of CRC in sister. Surveillance colonoscopy.  Eileen Arnold U 12/13/2014, 9:40 AM

## 2014-12-13 NOTE — Discharge Instructions (Signed)
No aspirin for 1 week. Hold Celebrex for 3 days. Resume other medications as before. High fiber diet. No driving for 24 hours. Physician will call with biopsy results.  Colonoscopy, Care After Refer to this sheet in the next few weeks. These instructions provide you with information on caring for yourself after your procedure. Your health care provider may also give you more specific instructions. Your treatment has been planned according to current medical practices, but problems sometimes occur. Call your health care provider if you have any problems or questions after your procedure. WHAT TO EXPECT AFTER THE PROCEDURE  After your procedure, it is typical to have the following:  A small amount of blood in your stool.  Moderate amounts of gas and mild abdominal cramping or bloating. HOME CARE INSTRUCTIONS  Do not drive, operate machinery, or sign important documents for 24 hours.  You may shower and resume your regular physical activities, but move at a slower pace for the first 24 hours.  Take frequent rest periods for the first 24 hours.  Walk around or put a warm pack on your abdomen to help reduce abdominal cramping and bloating.  Drink enough fluids to keep your urine clear or pale yellow.  You may resume your normal diet as instructed by your health care provider. Avoid heavy or fried foods that are hard to digest.  Avoid drinking alcohol for 24 hours or as instructed by your health care provider.  Only take over-the-counter or prescription medicines as directed by your health care provider.  If a tissue sample (biopsy) was taken during your procedure:  Do not take aspirin or blood thinners for 7 days, or as instructed by your health care provider.  Do not drink alcohol for 7 days, or as instructed by your health care provider.  Eat soft foods for the first 24 hours. SEEK MEDICAL CARE IF: You have persistent spotting of blood in your stool 2-3 days after the  procedure. SEEK IMMEDIATE MEDICAL CARE IF:  You have more than a small spotting of blood in your stool.  You pass large blood clots in your stool.  Your abdomen is swollen (distended).  You have nausea or vomiting.  You have a fever.  You have increasing abdominal pain that is not relieved with medicine. Document Released: 01/27/2004 Document Revised: 04/04/2013 Document Reviewed: 02/19/2013 [facility] Patient Information 2015 [place], [place]. This information is not intended to replace advice given to you by your health care provider. Make sure you discuss any questions you have with your health care provider.

## 2014-12-13 NOTE — Op Note (Signed)
COLONOSCOPY PROCEDURE REPORT  PATIENT:  Eileen Arnold  MR#:  [id number] Birthdate:  [birth year], 54 y.o., female Endoscopist:  Dr. Rogene Houston, MD Referred By:  Ms. Eileen Pretty, FNP  Procedure Date: 12/13/2014  Procedure:   Colonoscopy with snare polypectomy and clip application.  Indications:  Patient is 54 year old Caucasian female was history of colonic polyps and family history of colon carcinoma in her sister who was diagnosed at 62 and doing fine 9 years later.  Informed Consent:  The procedure and risks were reviewed with the patient and informed consent was obtained.  Medications:  Demerol 50 mg IV Versed 7 mg IV  Description of procedure:  After a digital rectal exam was performed, that colonoscope was advanced from the anus through the rectum and colon to the area of the cecum, ileocecal valve and appendiceal orifice. The cecum was deeply intubated. These structures were well-seen and photographed for the record. From the level of the cecum and ileocecal valve, the scope was slowly and cautiously withdrawn. The mucosal surfaces were carefully surveyed utilizing scope tip to flexion to facilitate fold flattening as needed. The scope was pulled down into the rectum where a thorough exam including retroflexion was performed.  Findings:   Prep satisfactory. Small cecal polyp coagulated with snare tip 2 small polyps were cold snared and submitted together. These are located at cecum and transverse colon. 10 mm oblong shaped polyp hot snared from transverse colon. Three resolution clips applied to deep polypectomy site. Get her diverticula at sigmoid colon. Small hemorrhoids below the dentate line.     Therapeutic/Diagnostic Maneuvers Performed:  See above  Complications:  None  Cecal Withdrawal Time:  31  minutes  Impression:  Examination performed to cecum. Small cecal polyp coagulated with snare tip Two small polyps were cold snared and submitted  together(cecum and transverse colon). 10 mm broad-based polyp hot snared from transverse colon and submitted separately. Polypectomy site was felt to be deep in 3 resolution clips applied to polypectomy site. Scattered diverticula at sigmoid colon. Small external hemorrhoids  Recommendations:  Standard instructions given. No aspirin for 1 week. Hold Celebrex for 3 days. I will contact patient with biopsy results and further recommendations.  EileenNAJEEB Arnold  12/13/2014 10:52 AM  CC: Dr. [name], FNP & Dr. Rayne Arnold ref. provider found

## 2014-12-16 ENCOUNTER — Encounter (HOSPITAL_COMMUNITY): Payer: Self-pay | Admitting: Internal Medicine

## 2015-01-02 ENCOUNTER — Encounter: Payer: Self-pay | Admitting: Physician Assistant

## 2015-01-02 ENCOUNTER — Ambulatory Visit (INDEPENDENT_AMBULATORY_CARE_PROVIDER_SITE_OTHER): Payer: Commercial Managed Care - HMO | Admitting: Physician Assistant

## 2015-01-02 VITALS — BP 145/82 | HR 80 | Temp 96.6°F | Ht 63.0 in | Wt 270.4 lb

## 2015-01-02 DIAGNOSIS — M25562 Pain in left knee: Secondary | ICD-10-CM

## 2015-01-02 NOTE — Progress Notes (Signed)
Subjective:     Patient ID: Eileen Arnold, female   DOB: [birth year], 54 y.o.   MRN: [id number]  HPI Pt with hx of intermit knee pain Now having L knee pain after staying at house that had 3 flights of steps Pain is to the ant knee Sx worse going down steps No locking or giving way of knee  Review of Systems     Objective:   Physical Exam No effusion to the knee FROM of the knee w/o crepitus + TTP with patellar compression Some TTP along the lateral joint lines No laxity Lachman/McMurray negative No calf TTP Good pulses and strength distal    Assessment:     1. Left knee pain        Plan:     Pt already on Celebrex so no further NSAIDS Heat/Ice [place] OTC cream Tylenol prn pain INB return for Xray and possible injection

## 2015-01-02 NOTE — Patient Instructions (Signed)

## 2015-06-12 ENCOUNTER — Encounter: Payer: Self-pay | Admitting: Family Medicine

## 2015-06-12 ENCOUNTER — Ambulatory Visit (INDEPENDENT_AMBULATORY_CARE_PROVIDER_SITE_OTHER): Payer: Commercial Managed Care - HMO | Admitting: Family Medicine

## 2015-06-12 VITALS — BP 142/80 | HR 97 | Temp 97.1°F | Ht 63.0 in | Wt 278.2 lb

## 2015-06-12 DIAGNOSIS — J0100 Acute maxillary sinusitis, unspecified: Secondary | ICD-10-CM | POA: Diagnosis not present

## 2015-06-12 MED ORDER — AMOXICILLIN-POT CLAVULANATE 875-125 MG PO TABS: 1.0000 | ORAL_TABLET | Freq: Two times a day (BID) | ORAL | Status: DC

## 2015-06-12 MED ORDER — BENZONATATE 100 MG PO CAPS: 100.0000 mg | ORAL_CAPSULE | Freq: Three times a day (TID) | ORAL | Status: DC | PRN

## 2015-06-12 NOTE — Patient Instructions (Signed)
Great to meet you!  Come back if you do not get better or worsen.   Be sure to finish all of the antibiotics.   Try tessalon for cough  Flonase two sprays once a day could be very helpful  Sinusitis, Adult Sinusitis is redness, soreness, and inflammation of the paranasal sinuses. Paranasal sinuses are air pockets within the bones of your face. They are located beneath your eyes, in the middle of your forehead, and above your eyes. In healthy paranasal sinuses, mucus is able to drain out, and air is able to circulate through them by way of your nose. However, when your paranasal sinuses are inflamed, mucus and air can become trapped. This can allow bacteria and other germs to grow and cause infection. Sinusitis can develop quickly and last only a short time (acute) or continue over a long period (chronic). Sinusitis that lasts for more than 12 weeks is considered chronic. CAUSES Causes of sinusitis include:  Allergies.  Structural abnormalities, such as displacement of the cartilage that separates your nostrils (deviated septum), which can decrease the air flow through your nose and sinuses and affect sinus drainage.  Functional abnormalities, such as when the small hairs (cilia) that line your sinuses and help remove mucus do not work properly or are not present. SIGNS AND SYMPTOMS Symptoms of acute and chronic sinusitis are the same. The primary symptoms are pain and pressure around the affected sinuses. Other symptoms include:  Upper toothache.  Earache.  Headache.  Bad breath.  Decreased sense of smell and taste.  A cough, which worsens when you are lying flat.  Fatigue.  Fever.  Thick drainage from your nose, which often is green and may contain pus (purulent).  Swelling and warmth over the affected sinuses. DIAGNOSIS Your health care provider will perform a physical exam. During your exam, your health care provider may perform any of the following to help determine if  you have acute sinusitis or chronic sinusitis:  Look in your nose for signs of abnormal growths in your nostrils (nasal polyps).  Tap over the affected sinus to check for signs of infection.  View the inside of your sinuses using an imaging device that has a light attached (endoscope). If your health care provider suspects that you have chronic sinusitis, one or more of the following tests may be recommended:  Allergy tests.  Nasal culture. A sample of mucus is taken from your nose, sent to a lab, and screened for bacteria.  Nasal cytology. A sample of mucus is taken from your nose and examined by your health care provider to determine if your sinusitis is related to an allergy. TREATMENT Most cases of acute sinusitis are related to a viral infection and will resolve on their own within 10 days. Sometimes, medicines are prescribed to help relieve symptoms of both acute and chronic sinusitis. These may include pain medicines, decongestants, nasal steroid sprays, or saline sprays. However, for sinusitis related to a bacterial infection, your health care provider will prescribe antibiotic medicines. These are medicines that will help kill the bacteria causing the infection. Rarely, sinusitis is caused by a fungal infection. In these cases, your health care provider will prescribe antifungal medicine. For some cases of chronic sinusitis, surgery is needed. Generally, these are cases in which sinusitis recurs more than 3 times per year, despite other treatments. HOME CARE INSTRUCTIONS  Drink plenty of water. Water helps thin the mucus so your sinuses can drain more easily.  Use a humidifier.  Inhale  steam 3-4 times a day (for example, sit in the bathroom with the shower running).  Apply a warm, moist washcloth to your face 3-4 times a day, or as directed by your health care provider.  Use saline nasal sprays to help moisten and clean your sinuses.  Take medicines only as directed by your  health care provider.  If you were prescribed either an antibiotic or antifungal medicine, finish it all even if you start to feel better. SEEK IMMEDIATE MEDICAL CARE IF:  You have increasing pain or severe headaches.  You have nausea, vomiting, or drowsiness.  You have swelling around your face.  You have vision problems.  You have a stiff neck.  You have difficulty breathing.   This information is not intended to replace advice given to you by your health care provider. Make sure you discuss any questions you have with your health care provider.   Document Released: 06/14/2005 Document Revised: 07/05/2014 Document Reviewed: 06/29/2011 Elsevier Interactive Patient Education [employer].

## 2015-06-12 NOTE — Progress Notes (Signed)
   HPI  Patient presents today  To Discussed acute illness.  Patient explains that she's had one week of nasal congestion, chest pain with cough, facial pain over her maxillary sinuses, and sore throat. She has no dyspnea. She's eating and drinking easily. She has subjective fever and malaise.  She states that her sinus pressure is probably the most annoying symptom.   PMH: Smoking status noted ROS: Per HPI  Objective: BP 142/80 mmHg  Pulse 97  Temp(Src) 97.1 F (36.2 C) (Oral)  Ht 5\' 3"  (1.6 m)  Wt 278 lb 3.2 oz (126.191 kg)  BMI 49.29 kg/m2 Gen: NAD, alert, cooperative with exam HEENT: NCAT, nares with swollen mucosa, facial tenderness over left maxillary sinus, TMs normal bilaterally, oropharynx clear CV: RRR, good S1/S2, no murmur Resp: CTABL, no wheezes, non-labored Ext: No edema, warm Neuro: Alert and oriented, No gross deficits  Assessment and plan:  # Acute maxillary sinusitis Treating with Augmentin Tessalon for cough Supportive care Return to clinic if worsening or does not improve as expected  # Low energy Patient would like to try over-the-counter B-12 supplementation and return if not improved She has Fam Hx of hypothyroidism  HCM Recommended pap, she is due.     Meds ordered this encounter  Medications  . Cetirizine HCl (ZYRTEC ALLERGY PO)    Sig: Take by mouth.  Marland Kitchen amoxicillin-clavulanate (AUGMENTIN) 875-125 MG tablet    Sig: Take 1 tablet by mouth 2 (two) times daily.    Dispense:  20 tablet    Refill:  0  . benzonatate (TESSALON PERLES) 100 MG capsule    Sig: Take 1 capsule (100 mg total) by mouth 3 (three) times daily as needed for cough.    Dispense:  20 capsule    Refill:  0    Laroy Apple, MD [facility] 06/12/2015, 1:10 PM

## 2015-07-03 ENCOUNTER — Ambulatory Visit: Payer: Commercial Managed Care - HMO | Admitting: Family

## 2015-07-04 ENCOUNTER — Encounter: Payer: Self-pay | Admitting: Family Medicine

## 2015-07-04 ENCOUNTER — Ambulatory Visit (INDEPENDENT_AMBULATORY_CARE_PROVIDER_SITE_OTHER): Payer: Commercial Managed Care - HMO

## 2015-07-04 ENCOUNTER — Ambulatory Visit (INDEPENDENT_AMBULATORY_CARE_PROVIDER_SITE_OTHER): Payer: Commercial Managed Care - HMO | Admitting: Family Medicine

## 2015-07-04 VITALS — BP 144/82 | HR 84 | Temp 97.1°F | Ht 63.0 in | Wt 273.2 lb

## 2015-07-04 DIAGNOSIS — R6889 Other general symptoms and signs: Secondary | ICD-10-CM

## 2015-07-04 LAB — POCT INFLUENZA A/B
INFLUENZA A, POC: NEGATIVE
Influenza B, POC: NEGATIVE

## 2015-07-04 MED ORDER — FLUTICASONE PROPIONATE 50 MCG/ACT NA SUSP: 2.0000 | Freq: Every day | NASAL | Status: DC

## 2015-07-04 MED ORDER — GUAIFENESIN-CODEINE 100-10 MG/5ML PO SOLN: 5.0000 mL | Freq: Three times a day (TID) | ORAL | Status: DC | PRN

## 2015-07-04 NOTE — Patient Instructions (Signed)
Great to see you!  We will call you later today with an interpretation of your chest x ray  Take mucinex dm or delsym for cough Codeine is a narcotic, do not drive afterwards  Try flonase 2 times daily for 1 week then 1 time daily  Come back if you get worse or do not get better.   Viral Infections A viral infection can be caused by different types of viruses.Most viral infections are not serious and resolve on their own. However, some infections may cause severe symptoms and may lead to further complications. SYMPTOMS Viruses can frequently cause:  Minor sore throat.  Aches and pains.  Headaches.  Runny nose.  Different types of rashes.  Watery eyes.  Tiredness.  Cough.  Loss of appetite.  Gastrointestinal infections, resulting in nausea, vomiting, and diarrhea. These symptoms do not respond to antibiotics because the infection is not caused by bacteria. However, you might catch a bacterial infection following the viral infection. This is sometimes called a "superinfection." Symptoms of such a bacterial infection may include:  Worsening sore throat with pus and difficulty swallowing.  Swollen neck glands.  Chills and a high or persistent fever.  Severe headache.  Tenderness over the sinuses.  Persistent overall ill feeling (malaise), muscle aches, and tiredness (fatigue).  Persistent cough.  Yellow, green, or brown mucus production with coughing. HOME CARE INSTRUCTIONS   Only take over-the-counter or prescription medicines for pain, discomfort, diarrhea, or fever as directed by your caregiver.  Drink enough water and fluids to keep your urine clear or pale yellow. Sports drinks can provide valuable electrolytes, sugars, and hydration.  Get plenty of rest and maintain proper nutrition. Soups and broths with crackers or rice are fine. SEEK IMMEDIATE MEDICAL CARE IF:   You have severe headaches, shortness of breath, chest pain, neck pain, or an unusual  rash.  You have uncontrolled vomiting, diarrhea, or you are unable to keep down fluids.  You or your child has an oral temperature above 102 F (38.9 C), not controlled by medicine.  Your baby is older than 3 months with a rectal temperature of 102 F (38.9 C) or higher.  Your baby is 11 months old or younger with a rectal temperature of 100.4 F (38 C) or higher. MAKE SURE YOU:   Understand these instructions.  Will watch your condition.  Will get help right away if you are not doing well or get worse.   This information is not intended to replace advice given to you by your health care provider. Make sure you discuss any questions you have with your health care provider.   Document Released: 03/24/2005 Document Revised: 09/06/2011 Document Reviewed: 11/20/2014 Elsevier Interactive Patient Education [employer].

## 2015-07-04 NOTE — Progress Notes (Signed)
   HPI  Patient presents today here today to discuss cough and cold symptoms.  Patient was seen December 15 for a similar illness which was treated with Augmentin, she completely resolved after that. If over the last 5 days she's had cough, nasal congestion, chest tightness, and sore throat. She also complains of malaise, subjective fever, chills.  PMH: Smoking status noted ROS: Per HPI  Objective: BP 144/82 mmHg  Pulse 84  Temp(Src) 97.1 F (36.2 C) (Oral)  Ht 5\' 3"  (1.6 m)  Wt 273 lb 3.2 oz (123.923 kg)  BMI 48.41 kg/m2 Gen: NAD, alert, cooperative with exam HEENT: NCAT, TMs normal bilaterally, nares with swollen turbinates bilaterally, oropharynx clear CV: RRR, good S1/S2, no murmur Resp: CTABL, no wheezes, non-labored Ext: No edema, warm Neuro: Alert and oriented, No gross deficits  DG chest Possible infiltrate at right heart border/lingula  Assessment and plan:  # Viral upper respiratory infection. Symptoms consistent with viral illness,  CXR with possible infiltrate but it is very faint, will await radiology read before treating Flu negative Guaifenesin with codeine for cough. Supportive care discussed Return to clinic if worsening or does not improve as expected    Orders Placed This Encounter  Procedures  . DG Chest 2 View    Standing Status: Future     Number of Occurrences:      Standing Expiration Date: 08/31/2016    Order Specific Question:  Reason for Exam (SYMPTOM  OR DIAGNOSIS REQUIRED)    Answer:  cough    Order Specific Question:  Is the patient pregnant?    Answer:  No    Order Specific Question:  Preferred imaging location?    Answer:  Internal    Meds ordered this encounter  Medications  . guaiFENesin-codeine 100-10 MG/5ML syrup    Sig: Take 5 mLs by mouth 3 (three) times daily as needed for cough.    Dispense:  120 mL    Refill:  0  . fluticasone (FLONASE) 50 MCG/ACT nasal spray    Sig: Place 2 sprays into both nostrils daily.   Dispense:  16 g    Refill:  [place], MD [facility] 07/04/2015, 11:26 AM

## 2015-08-28 ENCOUNTER — Telehealth: Payer: Self-pay | Admitting: Nurse Practitioner

## 2015-10-29 ENCOUNTER — Encounter (INDEPENDENT_AMBULATORY_CARE_PROVIDER_SITE_OTHER): Payer: Self-pay | Admitting: Internal Medicine

## 2015-10-29 ENCOUNTER — Ambulatory Visit (INDEPENDENT_AMBULATORY_CARE_PROVIDER_SITE_OTHER): Payer: Commercial Managed Care - HMO | Admitting: Internal Medicine

## 2015-10-29 VITALS — BP 200/100 | HR 64 | Temp 97.9°F | Ht 63.0 in | Wt 276.3 lb

## 2015-10-29 DIAGNOSIS — R197 Diarrhea, unspecified: Secondary | ICD-10-CM | POA: Diagnosis not present

## 2015-10-29 DIAGNOSIS — Z800 Family history of malignant neoplasm of digestive organs: Secondary | ICD-10-CM | POA: Insufficient documentation

## 2015-10-29 MED ORDER — METRONIDAZOLE 500 MG PO TABS: 500.0000 mg | ORAL_TABLET | Freq: Three times a day (TID) | ORAL | Status: DC

## 2015-10-29 NOTE — Progress Notes (Signed)
   Subjective:    Patient ID: Eileen Arnold, female    DOB: July 28, 1960, 55 y.o.   MRN: [id number]  HPIPresents today with c/o that she has to have a BM constantly. She says her stools are always loose. She says she constantly has the urge to have a BM. Symptoms x 3 weeks.  She is having  8-10 stools a day and mushy. She usually has about 4 stools a day. Stools usually are mushy.  Stools are very loose.She has not been on any recent antibiotics. She has not had a fever. No weight loss. It doesn't matter what she eats she has loose stools.  She says she has tried Imodium which did not helps. She says her stomach cramps. She says as soon as she has a BM, her stomach will cramp. Her appetite has been good. No melena or BRRB.        12/13/2014 Colonoscopy with snare polypectomy and clip application.  Indications: Patient is 55 year old Caucasian female was history of colonic polyps and family history of colon carcinoma in her sister who was diagnosed at 31 and doing fine 9 years later.  Impression:  Examination performed to cecum. Small cecal polyp coagulated with snare tip Two small polyps were cold snared and submitted together(cecum and transverse colon). 10 mm broad-based polyp hot snared from transverse colon and submitted separately. Polypectomy site was felt to be deep in 3 resolution clips applied to polypectomy site. Scattered diverticula at sigmoid colon. Small external hemorrhoids  Biopsy results reviewed with patient. 2 small polyps removed one is a tubular adenoma and the other one is sessile serrated polyp. 10 mm polyp at transverse colon is sessile serrated. Report to PCP. Colonoscopy in 5 years  Review of Systems Past Medical History  Diagnosis Date  . Family hx of colon cancer     Past Surgical History  Procedure Laterality Date  . Appendectomy    . Cholecystectomy    . Abdominal hysterectomy    . Breast reduction surgery Bilateral   . Oophorectomy Right  2011  . Bunionectomy Right   . Colonoscopy N/A 12/13/2014    Procedure: COLONOSCOPY;  Surgeon: Rogene Houston, MD;  Location: [facility];  Service: Endoscopy;  Laterality: N/A;  830 - moved to 9:15 - Ann to notify pt    No Known Allergies  Current Outpatient Prescriptions on File Prior to Visit  Medication Sig Dispense Refill  . Multiple Vitamins-Minerals (HAIR/SKIN/NAILS PO) Take 1 tablet by mouth daily.     No current facility-administered medications on file prior to visit.        Objective:   Physical Exam Blood pressure 200/100, pulse 64, temperature 97.9 F (36.6 C), height 5\' 3"  (1.6 m), weight 276 lb 4.8 oz (125.329 kg). Alert and oriented. Skin warm and dry. Oral mucosa is moist.   . Sclera anicteric, conjunctivae is pink. Thyroid not enlarged. No cervical lymphadenopathy. Lungs clear. Heart regular rate and rhythm.  Abdomen is soft. Bowel sounds are positive. No hepatomegaly. No abdominal masses felt. No tenderness.  No edema to lower extremities.          Assessment & Plan:  Diarrhea ? Infectious in nature.  Needs GI pathogen. Am going to cover her with Flagyl 500mg  TID x 7 days after the specimen. Imodium BID.

## 2015-10-29 NOTE — Patient Instructions (Signed)
GI pathogen. Flagyl 500mg  TID x 7 days after GI pathogen. Imodium one in am and one in pam.

## 2015-10-30 LAB — GASTROINTESTINAL PATHOGEN PANEL PCR
C. difficile Tox A/B, PCR: NOT DETECTED
CAMPYLOBACTER, PCR: NOT DETECTED
Cryptosporidium, PCR: NOT DETECTED
E COLI (ETEC) LT/ST, PCR: NOT DETECTED
E coli (STEC) stx1/stx2, PCR: NOT DETECTED
E coli 0157, PCR: NOT DETECTED
Giardia lamblia, PCR: NOT DETECTED
NOROVIRUS, PCR: NOT DETECTED
ROTAVIRUS, PCR: NOT DETECTED
SHIGELLA, PCR: NOT DETECTED
Salmonella, PCR: NOT DETECTED

## 2015-11-07 ENCOUNTER — Telehealth (INDEPENDENT_AMBULATORY_CARE_PROVIDER_SITE_OTHER): Payer: Self-pay | Admitting: Internal Medicine

## 2015-11-07 NOTE — Telephone Encounter (Signed)
Patient called, left a message stating that she was returning Terri's call.  [phone]

## 2015-11-10 ENCOUNTER — Telehealth (INDEPENDENT_AMBULATORY_CARE_PROVIDER_SITE_OTHER): Payer: Self-pay | Admitting: Internal Medicine

## 2015-11-10 DIAGNOSIS — R109 Unspecified abdominal pain: Secondary | ICD-10-CM

## 2015-11-10 MED ORDER — DICYCLOMINE HCL 10 MG PO CAPS: 10.0000 mg | ORAL_CAPSULE | Freq: Three times a day (TID) | ORAL | Status: DC

## 2015-11-10 NOTE — Telephone Encounter (Signed)
C/o abdominal cramping. Will call her in Dicyclomine 10mg  TID

## 2015-11-10 NOTE — Telephone Encounter (Signed)
rx for Dicyclomine 10mg  TID

## 2015-12-08 ENCOUNTER — Other Ambulatory Visit (INDEPENDENT_AMBULATORY_CARE_PROVIDER_SITE_OTHER): Payer: Self-pay | Admitting: Internal Medicine

## 2016-01-14 ENCOUNTER — Telehealth: Payer: Self-pay

## 2016-01-14 MED ORDER — FLUCONAZOLE 150 MG PO TABS: 150.0000 mg | ORAL_TABLET | Freq: Once | ORAL | Status: DC

## 2016-01-14 NOTE — Telephone Encounter (Signed)
Pt has a yeast infection and wants to know if something can be called in for this. She uses [facility] in [place]

## 2016-01-16 ENCOUNTER — Ambulatory Visit (INDEPENDENT_AMBULATORY_CARE_PROVIDER_SITE_OTHER): Payer: Commercial Managed Care - HMO | Admitting: Family

## 2016-01-16 VITALS — BP 138/82 | HR 102 | Temp 97.0°F | Ht 63.0 in | Wt 282.0 lb

## 2016-01-16 DIAGNOSIS — N3001 Acute cystitis with hematuria: Secondary | ICD-10-CM | POA: Diagnosis not present

## 2016-01-16 DIAGNOSIS — R309 Painful micturition, unspecified: Secondary | ICD-10-CM | POA: Diagnosis not present

## 2016-01-16 LAB — URINALYSIS
BILIRUBIN UA: NEGATIVE
GLUCOSE, UA: NEGATIVE
Nitrite, UA: POSITIVE — AB
Specific Gravity, UA: >1.03 — ABNORMAL HIGH (ref 1.005–1.030)
Urobilinogen, Ur: 1 mg/dL (ref 0.2–1.0)
pH, UA: 5.5 (ref 5.0–7.5)

## 2016-01-16 MED ORDER — SULFAMETHOXAZOLE-TRIMETHOPRIM 800-160 MG PO TABS: 1.0000 | ORAL_TABLET | Freq: Two times a day (BID) | ORAL | Status: DC

## 2016-01-16 NOTE — Patient Instructions (Signed)

## 2016-01-16 NOTE — Progress Notes (Signed)
   Subjective:    Patient ID: Eileen Arnold, female    DOB: Nov 09, 1960, 55 y.o.   MRN: [id number]  Dysuria  This is a new problem. The current episode started in the past 7 days. The problem has been gradually worsening. The quality of the pain is described as burning. The pain is at a severity of 7/10. The pain is mild. Associated symptoms include flank pain, frequency and urgency. Pertinent negatives include no chills, discharge, hematuria, hesitancy, nausea or vomiting. She has tried increased fluids (yeast cream) for the symptoms. The treatment provided no relief.      Review of Systems  Constitutional: Negative.  Negative for chills.  HENT: Negative.   Eyes: Negative.   Respiratory: Negative.  Negative for shortness of breath.   Cardiovascular: Negative.  Negative for palpitations.  Gastrointestinal: Negative.  Negative for nausea and vomiting.  Endocrine: Negative.   Genitourinary: Positive for dysuria, urgency, frequency and flank pain. Negative for hesitancy and hematuria.  Musculoskeletal: Negative.   Neurological: Negative.  Negative for headaches.  Hematological: Negative.   Psychiatric/Behavioral: Negative.   All other systems reviewed and are negative.      Objective:   Physical Exam  Constitutional: She is oriented to person, place, and time. She appears well-developed and well-nourished. No distress.  HENT:  Head: Normocephalic and atraumatic.  Eyes: Pupils are equal, round, and reactive to light.  Neck: Normal range of motion. Neck supple. No thyromegaly present.  Cardiovascular: Normal rate, regular rhythm, normal heart sounds and intact distal pulses.   No murmur heard. Pulmonary/Chest: Effort normal and breath sounds normal. No respiratory distress. She has no wheezes.  Abdominal: Soft. Bowel sounds are normal. She exhibits no distension. There is no tenderness.  Musculoskeletal: Normal range of motion. She exhibits no edema or tenderness.  Neurological: She  is alert and oriented to person, place, and time.  Skin: Skin is warm and dry.  Psychiatric: She has a normal mood and affect. Her behavior is normal. Judgment and thought content normal.  Vitals reviewed.     BP 138/82 mmHg  Pulse 102  Temp(Src) 97 F (36.1 C) (Oral)  Ht 5\' 3"  (1.6 m)  Wt 282 lb (127.914 kg)  BMI 49.97 kg/m2     Assessment & Plan:  1. Voiding pain - Urinalysis - Urine culture  2. Acute cystitis with hematuria -Force fluids AZO over the counter X2 days RTO prn Culture pending - sulfamethoxazole-trimethoprim (BACTRIM DS) 800-160 MG tablet; Take 1 tablet by mouth 2 (two) times daily.  Dispense: 14 tablet; Refill: 0  Evelina Dun, FNP

## 2016-01-20 LAB — URINE CULTURE

## 2016-03-16 ENCOUNTER — Ambulatory Visit (INDEPENDENT_AMBULATORY_CARE_PROVIDER_SITE_OTHER): Payer: Commercial Managed Care - HMO | Admitting: Family Medicine

## 2016-03-16 ENCOUNTER — Encounter: Payer: Self-pay | Admitting: Family Medicine

## 2016-03-16 VITALS — BP 134/74 | HR 83 | Temp 97.0°F | Ht 63.0 in | Wt 275.8 lb

## 2016-03-16 DIAGNOSIS — R300 Dysuria: Secondary | ICD-10-CM | POA: Diagnosis not present

## 2016-03-16 LAB — URINALYSIS
Bilirubin, UA: NEGATIVE
Glucose, UA: NEGATIVE
Ketones, UA: NEGATIVE
Leukocytes, UA: NEGATIVE
Nitrite, UA: NEGATIVE
Protein, UA: NEGATIVE
Specific Gravity, UA: 1.025 (ref 1.005–1.030)
Urobilinogen, Ur: 0.2 mg/dL (ref 0.2–1.0)
pH, UA: 5.5 (ref 5.0–7.5)

## 2016-03-16 MED ORDER — CIPROFLOXACIN HCL 250 MG PO TABS: 250.0000 mg | ORAL_TABLET | Freq: Two times a day (BID) | ORAL | 0 refills | Status: DC

## 2016-03-16 MED ORDER — CELECOXIB 200 MG PO CAPS: 200.0000 mg | ORAL_CAPSULE | Freq: Every day | ORAL | 3 refills | Status: DC

## 2016-03-16 NOTE — Patient Instructions (Addendum)
Great to see you!  Be sure to finish all antibiotics, we will culture the urine to double check for infection.   Come back in 2 weeks for a flu shot   Urinary Tract Infection Urinary tract infections (UTIs) can develop anywhere along your urinary tract. Your urinary tract is your body's drainage system for removing wastes and extra water. Your urinary tract includes two kidneys, two ureters, a bladder, and a urethra. Your kidneys are a pair of bean-shaped organs. Each kidney is about the size of your fist. They are located below your ribs, one on each side of your spine. CAUSES Infections are caused by microbes, which are microscopic organisms, including fungi, viruses, and bacteria. These organisms are so small that they can only be seen through a microscope. Bacteria are the microbes that most commonly cause UTIs. SYMPTOMS  Symptoms of UTIs may vary by age and gender of the patient and by the location of the infection. Symptoms in young women typically include a frequent and intense urge to urinate and a painful, burning feeling in the bladder or urethra during urination. Older women and men are more likely to be tired, shaky, and weak and have muscle aches and abdominal pain. A fever may mean the infection is in your kidneys. Other symptoms of a kidney infection include pain in your back or sides below the ribs, nausea, and vomiting. DIAGNOSIS To diagnose a UTI, your caregiver will ask you about your symptoms. Your caregiver will also ask you to provide a urine sample. The urine sample will be tested for bacteria and white blood cells. White blood cells are made by your body to help fight infection. TREATMENT  Typically, UTIs can be treated with medication. Because most UTIs are caused by a bacterial infection, they usually can be treated with the use of antibiotics. The choice of antibiotic and length of treatment depend on your symptoms and the type of bacteria causing your infection. HOME CARE  INSTRUCTIONS  If you were prescribed antibiotics, take them exactly as your caregiver instructs you. Finish the medication even if you feel better after you have only taken some of the medication.  Drink enough water and fluids to keep your urine clear or pale yellow.  Avoid caffeine, tea, and carbonated beverages. They tend to irritate your bladder.  Empty your bladder often. Avoid holding urine for long periods of time.  Empty your bladder before and after sexual intercourse.  After a bowel movement, women should cleanse from front to back. Use each tissue only once. SEEK MEDICAL CARE IF:   You have back pain.  You develop a fever.  Your symptoms do not begin to resolve within 3 days. SEEK IMMEDIATE MEDICAL CARE IF:   You have severe back pain or lower abdominal pain.  You develop chills.  You have nausea or vomiting.  You have continued burning or discomfort with urination. MAKE SURE YOU:   Understand these instructions.  Will watch your condition.  Will get help right away if you are not doing well or get worse.   This information is not intended to replace advice given to you by your health care provider. Make sure you discuss any questions you have with your health care provider.   Document Released: 03/24/2005 Document Revised: 03/05/2015 Document Reviewed: 07/23/2011 Elsevier Interactive Patient Education [employer].

## 2016-03-16 NOTE — Progress Notes (Signed)
   HPI  Patient presents today here with concern for UTI.  Patient's lines of the last 4 days she's had lower back pain very different than her usual back pain and dysuria. She denies any urinary frequency, suprapubic pain, or fevers.  She denies chills, sweats, or difficulty tolerating food.  Nodes are very similar to previous UTI in July of this year. That was treated with Bactrim  PMH: Smoking status noted ROS: Per HPI  Objective: BP 134/74   Pulse 83   Temp 97 F (36.1 C) (Oral)   Ht 5\' 3"  (1.6 m)   Wt 275 lb 12.8 oz (125.1 kg)   BMI 48.86 kg/m  Gen: NAD, alert, cooperative with exam HEENT: NCAT, EOMI, PERRL CV: RRR, good S1/S2, no murmur Resp: CTABL, no wheezes, non-labored Abd: SNTND, BS present, no guarding or organomegaly and no CVA tenderness Ext: No edema, warm Neuro: Alert and oriented, No gross deficits MSK:  Tenderness  to palpation bilateral paraspinal muscles and lumbar back  Assessment and plan:  # Dysuria Given characteristic symptoms of I have treated her with Cipro for 3 days, despite her urinalysis not appearing to have a UTI. ( only positive for trace blood, micro not avaliable at night clinic)  We discussed this and if she begins having recurrent UTIs I would recommend being more critical of her urinalysis. Sent for culture Return to clinic with any concerns or worsening symptoms     Orders Placed This Encounter  Procedures  . Urine culture  . Urinalysis    Meds ordered this encounter  Medications  . celecoxib (CELEBREX) 200 MG capsule    Sig: Take 1 capsule (200 mg total) by mouth daily.    Dispense:  30 capsule    Refill:  3  . ciprofloxacin (CIPRO) 250 MG tablet    Sig: Take 1 tablet (250 mg total) by mouth 2 (two) times daily.    Dispense:  6 tablet    Refill:  0    Laroy Apple, MD [facility] 03/16/2016, 6:43 PM

## 2016-03-19 LAB — URINE CULTURE

## 2016-03-22 ENCOUNTER — Encounter: Payer: Self-pay | Admitting: Family Medicine

## 2016-03-22 ENCOUNTER — Ambulatory Visit (INDEPENDENT_AMBULATORY_CARE_PROVIDER_SITE_OTHER): Payer: Commercial Managed Care - HMO | Admitting: Family Medicine

## 2016-03-22 VITALS — BP 136/76 | HR 82 | Temp 96.8°F | Ht 63.0 in | Wt 275.4 lb

## 2016-03-22 DIAGNOSIS — L0291 Cutaneous abscess, unspecified: Secondary | ICD-10-CM

## 2016-03-22 DIAGNOSIS — M533 Sacrococcygeal disorders, not elsewhere classified: Secondary | ICD-10-CM | POA: Diagnosis not present

## 2016-03-22 DIAGNOSIS — R300 Dysuria: Secondary | ICD-10-CM

## 2016-03-22 LAB — URINALYSIS, COMPLETE
Bilirubin, UA: NEGATIVE
GLUCOSE, UA: NEGATIVE
Ketones, UA: NEGATIVE
Leukocytes, UA: NEGATIVE
NITRITE UA: NEGATIVE
Protein, UA: NEGATIVE
RBC, UA: NEGATIVE
Specific Gravity, UA: 1.01 (ref 1.005–1.030)
UUROB: 0.2 mg/dL (ref 0.2–1.0)
pH, UA: 6 (ref 5.0–7.5)

## 2016-03-22 LAB — MICROSCOPIC EXAMINATION

## 2016-03-22 MED ORDER — CEPHALEXIN 500 MG PO CAPS: 500.0000 mg | ORAL_CAPSULE | Freq: Four times a day (QID) | ORAL | 0 refills | Status: DC

## 2016-03-22 NOTE — Progress Notes (Signed)
   HPI  Patient presents today here with continued dysuria, low back pain, and a knot under her right breast.  Dysuria No improvement after Cipro, her urine culture did not grow any bacteria.  Right-sided low back pain, lateral the right greater than left. Has been going on for about the same amount of time as the dysuria, approximately 9-10 days symptoms. No injury, sees a chiropractor once a week for ongoing problems which usually helps.  No  fever, chills, sweats  Abscess One-day symptoms of steadily worsening erythema and swelling of a small area under her right breast No previous episodes similar to this in that area. No fever, chills, sweats, tolerating food and fluids normally.   PMH: Smoking status noted ROS: Per HPI  Objective: BP 136/76   Pulse 82   Temp (!) 96.8 F (36 C) (Oral)   Ht 5\' 3"  (1.6 m)   Wt 275 lb 6.4 oz (124.9 kg)   BMI 48.78 kg/m  Gen: NAD, alert, cooperative with exam HEENT: NCAT CV: RRR, good S1/S2, no murmur Resp: CTABL, no wheezes, non-labored Ext: No edema, warm Neuro: Alert and oriented, No gross deficits  MSK Tenderness to palpation of right-sided SI joint, no midline tenderness or paraspinal muscle tenderness.  Skin Approximately 3 cm erythematous area under the right breast, palpating beneath it it is about 2 cm deep, fluctuant, erythematous, and has mild induration.  Abscess incision and drainage Area was cleaned with alcohol, anesthetized using 1-1/2 mL of 2% Xylocaine with epinephrine The area was then cleaned 2 with Betadine and wiped clear with alcohol Using an 11 blade a 1-1/2 cm incision was made across the top expressing purulent material which was cultured.  The area was palpated firmly and explored with hemostats breaking septations and removing additional fluid.  About 2 inches of 1/4 inch iodoform was packed and the area was covered with simple gauze and tape dressing  Assessment and plan:  # Abscess Drained  today, culture sent Remove packing in 2 days Keflex Discussed supportive care and usual wound healing. Return to clinic with any concerns  # SI joint dysfunction Discussed stretches, ice, heat, Flexeril Continue Celebrex She generally gets good benefit from her chiropractor, encouraged her to return  Dysuria-no signs of UTI. Culture was negative Continue to follow/monitor   Orders Placed This Encounter  Procedures  . Urinalysis, Complete    Meds ordered this encounter  Medications  . cephALEXin (KEFLEX) 500 MG capsule    Sig: Take 1 capsule (500 mg total) by mouth 4 (four) times daily.    Dispense:  28 capsule    Refill:  [place], MD [facility] 03/22/2016, 4:30 PM

## 2016-03-22 NOTE — Patient Instructions (Signed)
Great to see you!  Try Flexeril, stretches, massage, and ice for your back pain.  Using Flexeril only at night can feel be beneficial.  For your abscess: Finish all antibiotics, keep the area covered and dry. It's okay to shower but do not soak in a tub until you have completely healed.  Please call or come back if you have any concerns or worsening symptoms.

## 2016-03-26 LAB — ANAEROBIC AND AEROBIC CULTURE

## 2016-04-13 ENCOUNTER — Other Ambulatory Visit: Payer: Self-pay

## 2016-04-13 MED ORDER — CELECOXIB 200 MG PO CAPS: 200.0000 mg | ORAL_CAPSULE | Freq: Every day | ORAL | 0 refills | Status: DC

## 2016-05-10 ENCOUNTER — Other Ambulatory Visit: Payer: Self-pay | Admitting: *Deleted

## 2016-05-10 MED ORDER — FLUTICASONE PROPIONATE 50 MCG/ACT NA SUSP: 2.0000 | Freq: Every day | NASAL | 6 refills | Status: DC

## 2016-05-10 NOTE — Telephone Encounter (Signed)
Pt requesting flonase  It was not on med list -- I added it 0 if that's ok - just sign and it will route to pharm

## 2016-07-05 ENCOUNTER — Encounter: Payer: Self-pay | Admitting: Family Medicine

## 2016-07-05 ENCOUNTER — Ambulatory Visit (INDEPENDENT_AMBULATORY_CARE_PROVIDER_SITE_OTHER): Payer: Commercial Managed Care - HMO | Admitting: Family Medicine

## 2016-07-05 VITALS — BP 132/78 | HR 84 | Temp 97.2°F | Ht 63.0 in | Wt 262.0 lb

## 2016-07-05 DIAGNOSIS — J0110 Acute frontal sinusitis, unspecified: Secondary | ICD-10-CM | POA: Diagnosis not present

## 2016-07-05 DIAGNOSIS — R52 Pain, unspecified: Secondary | ICD-10-CM

## 2016-07-05 LAB — VERITOR FLU A/B WAIVED
INFLUENZA B: NEGATIVE
Influenza A: NEGATIVE

## 2016-07-05 MED ORDER — CELECOXIB 200 MG PO CAPS: 200.0000 mg | ORAL_CAPSULE | Freq: Every day | ORAL | 1 refills | Status: DC

## 2016-07-05 MED ORDER — AZITHROMYCIN 250 MG PO TABS: ORAL_TABLET | ORAL | 0 refills | Status: DC

## 2016-07-05 NOTE — Progress Notes (Signed)
BP 132/78   Pulse 84   Temp 97.2 F (36.2 C) (Oral)   Ht 5\' 3"  (1.6 m)   Wt 262 lb (118.8 kg)   BMI 46.41 kg/m    Subjective:    Patient ID: Eileen Arnold, female    DOB: Sep 27, 1960, 56 y.o.   MRN: [id number]  HPI: Eileen Arnold is a 56 y.o. female presenting on 07/05/2016 for Sinusitis (nasal congestion and pressure, runny nose; symptoms began 6 days ago; has been taking Mucinex, started Zyrtec-D Friday afternoon); Cough; and Medication Refill (Celebrex)   HPI Sinus congestion and runny nose and aches Patient comes in with sinus congestion and runny nose and body aches that have been going on for the past 6 days. She's been using Mucinex and Zyrtec and cough medication to try and help with this and does not feel like they are helping much. She denies any fevers or chills or shortness of breath or wheezing. Her cough is mostly nonproductive.  Patient is also coming in today for refill on her Celebrex  Relevant past medical, surgical, family and social history reviewed and updated as indicated. Interim medical history since our last visit reviewed. Allergies and medications reviewed and updated.  Review of Systems  Constitutional: Negative for chills and fever.  HENT: Positive for congestion, postnasal drip, rhinorrhea, sinus pressure, sneezing and sore throat. Negative for ear discharge and ear pain.   Eyes: Negative for pain, redness and visual disturbance.  Respiratory: Positive for cough. Negative for chest tightness and shortness of breath.   Cardiovascular: Negative for chest pain and leg swelling.  Genitourinary: Negative for difficulty urinating and dysuria.  Musculoskeletal: Negative for back pain and gait problem.  Skin: Negative for rash.  Neurological: Negative for light-headedness and headaches.  Psychiatric/Behavioral: Negative for agitation and behavioral problems.  All other systems reviewed and are negative.   Per HPI unless specifically indicated  above     Objective:    BP 132/78   Pulse 84   Temp 97.2 F (36.2 C) (Oral)   Ht 5\' 3"  (1.6 m)   Wt 262 lb (118.8 kg)   BMI 46.41 kg/m   Wt Readings from Last 3 Encounters:  07/05/16 262 lb (118.8 kg)  03/22/16 275 lb 6.4 oz (124.9 kg)  03/16/16 275 lb 12.8 oz (125.1 kg)    Physical Exam  Constitutional: She is oriented to person, place, and time. She appears well-developed and well-nourished. No distress.  HENT:  Right Ear: Tympanic membrane, external ear and ear canal normal.  Left Ear: Tympanic membrane, external ear and ear canal normal.  Nose: Mucosal edema and rhinorrhea present. No epistaxis. Right sinus exhibits no maxillary sinus tenderness and no frontal sinus tenderness. Left sinus exhibits no maxillary sinus tenderness and no frontal sinus tenderness.  Mouth/Throat: Uvula is midline and mucous membranes are normal. Posterior oropharyngeal edema and posterior oropharyngeal erythema present. No oropharyngeal exudate or tonsillar abscesses.  Eyes: Conjunctivae are normal.  Cardiovascular: Normal rate, regular rhythm, normal heart sounds and intact distal pulses.   No murmur heard. Pulmonary/Chest: Effort normal and breath sounds normal. No respiratory distress. She has no wheezes. She has no rales.  Musculoskeletal: Normal range of motion. She exhibits no edema or tenderness.  Neurological: She is alert and oriented to person, place, and time. Coordination normal.  Skin: Skin is warm and dry. No rash noted. She is not diaphoretic.  Psychiatric: She has a normal mood and affect. Her behavior is normal.  Vitals reviewed.  Influenza:Negative    Assessment & Plan:   Problem List Items Addressed This Visit    None    Visit Diagnoses    Body aches    -  Primary   Relevant Medications   azithromycin (ZITHROMAX) 250 MG tablet   Other Relevant Orders   Veritor Flu A/B Waived (Completed)   Acute non-recurrent frontal sinusitis       Relevant Medications    azithromycin (ZITHROMAX) 250 MG tablet       Follow up plan: Return if symptoms worsen or fail to improve.  Counseling provided for all of the vaccine components Orders Placed This Encounter  Procedures  . Veritor Flu A/B [place], MD [facility] 07/05/2016, 3:43 PM

## 2016-07-08 ENCOUNTER — Telehealth: Payer: Self-pay

## 2016-07-08 NOTE — Telephone Encounter (Signed)
Pt is not feeling any better and says she only has one pill left from the z-pak. She also says the medicine has messed her stomach up, so can she get something for that? Does she need to be seen or will he call something in for her?

## 2016-07-08 NOTE — Telephone Encounter (Signed)
For the stomach issue I would have her go ahead and do [place] yogurt every day or a probiotic. Those aren't prescription so she can pick them up at any grocery store. For the Z-Pak I would say go ahead and continue with a continues to be in your system for at least 5 days after so is still working and have her do the Flonase and the Mucinex and nasal saline sprays along with it. If it still not improved after that, however come back and see [place]

## 2016-07-08 NOTE — Telephone Encounter (Signed)
Pt is aware.  

## 2016-07-19 ENCOUNTER — Other Ambulatory Visit: Payer: Self-pay | Admitting: Nurse Practitioner

## 2016-07-19 ENCOUNTER — Telehealth: Payer: Self-pay | Admitting: *Deleted

## 2016-07-19 MED ORDER — ONDANSETRON HCL 4 MG PO TABS: 4.0000 mg | ORAL_TABLET | Freq: Three times a day (TID) | ORAL | 0 refills | Status: DC | PRN

## 2016-07-19 NOTE — Progress Notes (Signed)
zofran sent to pharmacy. 

## 2016-07-19 NOTE — Progress Notes (Signed)
Patient aware rx sent to pharmacy.  

## 2016-08-03 ENCOUNTER — Ambulatory Visit (INDEPENDENT_AMBULATORY_CARE_PROVIDER_SITE_OTHER): Payer: Commercial Managed Care - HMO | Admitting: Internal Medicine

## 2016-08-03 ENCOUNTER — Encounter (INDEPENDENT_AMBULATORY_CARE_PROVIDER_SITE_OTHER): Payer: Self-pay | Admitting: Internal Medicine

## 2016-08-03 VITALS — BP 150/90 | HR 80 | Temp 97.5°F | Ht 63.0 in | Wt 253.7 lb

## 2016-08-03 DIAGNOSIS — R110 Nausea: Secondary | ICD-10-CM | POA: Diagnosis not present

## 2016-08-03 LAB — COMPREHENSIVE METABOLIC PANEL
ALBUMIN: 3.6 g/dL (ref 3.6–5.1)
ALT: 28 U/L (ref 6–29)
AST: 30 U/L (ref 10–35)
Alkaline Phosphatase: 80 U/L (ref 33–130)
BILIRUBIN TOTAL: 0.5 mg/dL (ref 0.2–1.2)
BUN: 12 mg/dL (ref 7–25)
CO2: 28 mmol/L (ref 20–31)
Calcium: 8.7 mg/dL (ref 8.6–10.4)
Chloride: 108 mmol/L (ref 98–110)
Creat: 0.75 mg/dL (ref 0.50–1.05)
GLUCOSE: 91 mg/dL (ref 65–99)
Potassium: 4.8 mmol/L (ref 3.5–5.3)
Sodium: 142 mmol/L (ref 135–146)
Total Protein: 6.1 g/dL (ref 6.1–8.1)

## 2016-08-03 NOTE — Patient Instructions (Addendum)
Bland diet. CMET

## 2016-08-03 NOTE — Progress Notes (Signed)
   Subjective:    Patient ID: Eileen Arnold, female    DOB: 1960/08/27, 56 y.o.   MRN: [id number] Wt 10/29/2015 276 HPIPresents today with c/o that when she had the flu in January. She says she was sick for 2 weeks. If she eats anything heavy, she vomits. Basically she is eating cream potatoes and crackers, soft vegetables. She is not eating meat. She also had a sinus infection in January and was placed on Zithromax. She did take the flu shot.  Has been over the flu about 2-3 weeks. She is back to work.  She is having a BM x 1 a day.   12/13/2014 Colonoscopy with snare polypectomy and clip application.  Indications: Patient is 56 year old Caucasian female was history of colonic polyps and family history of colon carcinoma in her sister who was diagnosed at 38 and doing fine 9 years later.  Impression:  Examination performed to cecum. Small cecal polyp coagulated with snare tip Two small polyps were cold snared and submitted together(cecum and transverse colon). 10 mm broad-based polyp hot snared from transverse colon and submitted separately. Polypectomy site was felt to be deep in 3 resolution clips applied to polypectomy site. Scattered diverticula at sigmoid colon. Small external hemorrhoids  Biopsy results reviewed with patient. 2 small polyps removed one is a tubular adenoma and the other one is sessile serrated polyp. 10 mm polyp at transverse colon is sessile serrated. Report to PCP. Colonoscopy in 5 years  Review of Systems Past Medical History:  Diagnosis Date  . Family hx of colon cancer     Past Surgical History:  Procedure Laterality Date  . ABDOMINAL HYSTERECTOMY    . APPENDECTOMY    . BREAST REDUCTION SURGERY Bilateral   . BUNIONECTOMY Right   . CHOLECYSTECTOMY    . COLONOSCOPY N/A 12/13/2014   Procedure: COLONOSCOPY;  Surgeon: Rogene Houston, MD;  Location: [facility];  Service: Endoscopy;  Laterality: N/A;  830 - moved to 9:15 - Ann to notify pt  .  OOPHORECTOMY Right 2011    No Known Allergies  Current Outpatient Prescriptions on File Prior to Visit  Medication Sig Dispense Refill  . celecoxib (CELEBREX) 200 MG capsule Take 1 capsule (200 mg total) by mouth daily. 90 capsule 1  . Multiple Vitamins-Minerals (HAIR/SKIN/NAILS PO) Take 1 tablet by mouth daily.    . ondansetron (ZOFRAN) 4 MG tablet Take 1 tablet (4 mg total) by mouth every 8 (eight) hours as needed for nausea or vomiting. 20 tablet 0   No current facility-administered medications on file prior to visit.        Objective:   Physical Exam Blood pressure (!) 150/90, pulse 80, temperature 97.5 F (36.4 C), height 5\' 3"  (1.6 m), weight 253 lb 11.2 oz (115.1 kg). Alert and oriented. Skin warm and dry. Oral mucosa is moist.   . Sclera anicteric, conjunctivae is pink. Thyroid not enlarged. No cervical lymphadenopathy. Lungs clear. Heart regular rate and rhythm.  Abdomen is soft. Bowel sounds are positive. No hepatomegaly. No abdominal masses felt. No tenderness.  No edema to lower extremities.          Assessment & Plan:  Viral syndrome. Am going to give her a bland diet and she can advance it slowly. CMET today.

## 2016-08-26 DIAGNOSIS — L821 Other seborrheic keratosis: Secondary | ICD-10-CM | POA: Diagnosis not present

## 2016-08-26 DIAGNOSIS — L820 Inflamed seborrheic keratosis: Secondary | ICD-10-CM | POA: Diagnosis not present

## 2016-08-26 DIAGNOSIS — C44529 Squamous cell carcinoma of skin of other part of trunk: Secondary | ICD-10-CM | POA: Diagnosis not present

## 2016-08-26 DIAGNOSIS — L570 Actinic keratosis: Secondary | ICD-10-CM | POA: Diagnosis not present

## 2016-08-26 DIAGNOSIS — X32XXXD Exposure to sunlight, subsequent encounter: Secondary | ICD-10-CM | POA: Diagnosis not present

## 2016-08-26 DIAGNOSIS — D1801 Hemangioma of skin and subcutaneous tissue: Secondary | ICD-10-CM | POA: Diagnosis not present

## 2016-08-26 DIAGNOSIS — D225 Melanocytic nevi of trunk: Secondary | ICD-10-CM | POA: Diagnosis not present

## 2016-08-30 NOTE — Telephone Encounter (Signed)
Medication sent to pharmacy  

## 2016-10-04 DIAGNOSIS — M25579 Pain in unspecified ankle and joints of unspecified foot: Secondary | ICD-10-CM | POA: Diagnosis not present

## 2016-10-04 DIAGNOSIS — M79671 Pain in right foot: Secondary | ICD-10-CM | POA: Diagnosis not present

## 2016-10-04 DIAGNOSIS — M722 Plantar fascial fibromatosis: Secondary | ICD-10-CM | POA: Diagnosis not present

## 2016-10-07 DIAGNOSIS — Z85828 Personal history of other malignant neoplasm of skin: Secondary | ICD-10-CM | POA: Diagnosis not present

## 2016-10-07 DIAGNOSIS — Z08 Encounter for follow-up examination after completed treatment for malignant neoplasm: Secondary | ICD-10-CM | POA: Diagnosis not present

## 2016-10-11 DIAGNOSIS — M25579 Pain in unspecified ankle and joints of unspecified foot: Secondary | ICD-10-CM | POA: Diagnosis not present

## 2016-10-11 DIAGNOSIS — M79671 Pain in right foot: Secondary | ICD-10-CM | POA: Diagnosis not present

## 2016-10-12 ENCOUNTER — Encounter: Payer: Self-pay | Admitting: Family

## 2016-10-12 ENCOUNTER — Ambulatory Visit (INDEPENDENT_AMBULATORY_CARE_PROVIDER_SITE_OTHER): Payer: Commercial Managed Care - HMO | Admitting: Family

## 2016-10-12 VITALS — BP 145/77 | HR 78 | Temp 97.0°F | Wt 257.0 lb

## 2016-10-12 DIAGNOSIS — R350 Frequency of micturition: Secondary | ICD-10-CM | POA: Diagnosis not present

## 2016-10-12 DIAGNOSIS — R399 Unspecified symptoms and signs involving the genitourinary system: Secondary | ICD-10-CM

## 2016-10-12 LAB — MICROSCOPIC EXAMINATION
Epithelial Cells (non renal): >10 /hpf — AB (ref 0–10)
RBC, UA: NONE SEEN /hpf (ref 0–?)
Renal Epithel, UA: NONE SEEN /hpf

## 2016-10-12 LAB — URINALYSIS, COMPLETE
BILIRUBIN UA: NEGATIVE
Glucose, UA: NEGATIVE
Leukocytes, UA: NEGATIVE
Nitrite, UA: NEGATIVE
PH UA: 5.5 (ref 5.0–7.5)
RBC UA: NEGATIVE
Specific Gravity, UA: 1.02 (ref 1.005–1.030)
UUROB: 0.2 mg/dL (ref 0.2–1.0)

## 2016-10-12 LAB — GLUCOSE HEMOCUE WAIVED: GLU HEMOCUE WAIVED: 67 mg/dL (ref 65–99)

## 2016-10-12 MED ORDER — NITROFURANTOIN MONOHYD MACRO 100 MG PO CAPS: 100.0000 mg | ORAL_CAPSULE | Freq: Two times a day (BID) | ORAL | 0 refills | Status: DC

## 2016-10-12 NOTE — Progress Notes (Signed)
   Subjective:    Patient ID: [name], female    DOB: [birth year], 56 y.o.   MRN: [id number]  Urinary Frequency   This is a new problem. The current episode started more than 1 month ago. The problem occurs intermittently. The problem has been waxing and waning. The quality of the pain is described as burning. The pain is at a severity of 4/10. The patient is experiencing no pain. Associated symptoms include frequency, hematuria, hesitancy and urgency. Pertinent negatives include no flank pain, nausea or vomiting. She has tried increased fluids for the symptoms. The treatment provided mild relief.      Review of Systems  Gastrointestinal: Negative for nausea and vomiting.  Genitourinary: Positive for frequency, hematuria, hesitancy and urgency. Negative for flank pain.  All other systems reviewed and are negative.      Objective:   Physical Exam  Constitutional: She is oriented to person, place, and time. She appears well-developed and well-nourished. No distress.  Morbid obese   HENT:  Head: Normocephalic.  Eyes: Pupils are equal, round, and reactive to light.  Neck: Normal range of motion. Neck supple. No thyromegaly present.  Cardiovascular: Normal rate, regular rhythm, normal heart sounds and intact distal pulses.   No murmur heard. Pulmonary/Chest: Effort normal and breath sounds normal. No respiratory distress. She has no wheezes.  Abdominal: Soft. Bowel sounds are normal. She exhibits no distension. There is no tenderness.  Musculoskeletal: Normal range of motion. She exhibits no edema or tenderness.  Neurological: She is alert and oriented to person, place, and time.  Skin: Skin is warm and dry.  Psychiatric: She has a normal mood and affect. Her behavior is normal. Judgment and thought content normal.  Vitals reviewed.     BP (!) 151/79   Pulse 81   Temp 97 F (36.1 C) (Oral)   Wt 257 lb (116.6 kg)   BMI 45.53 kg/m      Assessment & Plan:  1. Urine  frequency - Urinalysis, Complete - Glucose Hemocue Waived - CMP14+EGFR - Urine culture  2. UTI symptoms - Urine culture - nitrofurantoin, macrocrystal-monohydrate, (MACROBID) 100 MG capsule; Take 1 capsule (100 mg total) by mouth 2 (two) times daily.  Dispense: 10 capsule; Refill: 0  Will treat with Macrobid today  Urine culture pending- If negative will treat as OAB Blood glucose 67 today RTO in 2 weeks for CPE and recheck urine  Evelina Dun, FNP

## 2016-10-12 NOTE — Patient Instructions (Addendum)

## 2016-10-13 LAB — CMP14+EGFR
ALT: 22 IU/L (ref 0–32)
AST: 18 IU/L (ref 0–40)
Albumin/Globulin Ratio: 1.9 (ref 1.2–2.2)
Albumin: 4.4 g/dL (ref 3.5–5.5)
Alkaline Phosphatase: 83 IU/L (ref 39–117)
BILIRUBIN TOTAL: 0.4 mg/dL (ref 0.0–1.2)
BUN/Creatinine Ratio: 24 — ABNORMAL HIGH (ref 9–23)
BUN: 18 mg/dL (ref 6–24)
CALCIUM: 9.7 mg/dL (ref 8.7–10.2)
CHLORIDE: 101 mmol/L (ref 96–106)
CO2: 26 mmol/L (ref 18–29)
Creatinine, Ser: 0.74 mg/dL (ref 0.57–1.00)
GFR calc non Af Amer: 91 mL/min/{1.73_m2} (ref >59)
GFR, EST AFRICAN AMERICAN: 105 mL/min/{1.73_m2} (ref >59)
Globulin, Total: 2.3 g/dL (ref 1.5–4.5)
Glucose: 89 mg/dL (ref 65–99)
POTASSIUM: 4.2 mmol/L (ref 3.5–5.2)
Sodium: 141 mmol/L (ref 134–144)
Total Protein: 6.7 g/dL (ref 6.0–8.5)

## 2016-10-14 ENCOUNTER — Other Ambulatory Visit: Payer: Self-pay | Admitting: Family

## 2016-10-14 LAB — URINE CULTURE

## 2016-10-14 MED ORDER — MIRABEGRON ER 25 MG PO TB24: 25.0000 mg | ORAL_TABLET | Freq: Every day | ORAL | 1 refills | Status: DC

## 2016-10-15 ENCOUNTER — Telehealth: Payer: Self-pay

## 2016-10-15 MED ORDER — FLUCONAZOLE 150 MG PO TABS: ORAL_TABLET | ORAL | 0 refills | Status: DC

## 2016-10-15 NOTE — Telephone Encounter (Signed)
Patient aware that Rx has been sent to pharmacy 

## 2016-10-15 NOTE — Telephone Encounter (Signed)
RX sent, covering for PCP  Laroy Apple, MD [facility] 10/15/2016, 5:13 PM

## 2016-10-15 NOTE — Addendum Note (Signed)
Addended by: Timmothy Euler on: 10/15/2016 05:14 PM   Modules accepted: Orders

## 2016-10-15 NOTE — Telephone Encounter (Signed)
Pt was seen on 10/12/16 and was given an antibiotic. Now wants meds for yeast infection Uses [facility] in [place]

## 2016-10-15 NOTE — Telephone Encounter (Signed)
Patient states that when she usually takes an antibiotic she get a yeast infection.  Patient would like to have something sent to pharmacy to cover yeast infection

## 2016-10-21 DIAGNOSIS — M7731 Calcaneal spur, right foot: Secondary | ICD-10-CM | POA: Diagnosis not present

## 2016-10-21 DIAGNOSIS — M79671 Pain in right foot: Secondary | ICD-10-CM | POA: Diagnosis not present

## 2016-10-25 ENCOUNTER — Ambulatory Visit (INDEPENDENT_AMBULATORY_CARE_PROVIDER_SITE_OTHER): Payer: Commercial Managed Care - HMO | Admitting: Family

## 2016-10-25 ENCOUNTER — Encounter: Payer: Self-pay | Admitting: Family

## 2016-10-25 VITALS — BP 130/79 | HR 84 | Temp 97.0°F | Ht 63.0 in | Wt 262.0 lb

## 2016-10-25 DIAGNOSIS — Z0000 Encounter for general adult medical examination without abnormal findings: Secondary | ICD-10-CM

## 2016-10-25 DIAGNOSIS — Z114 Encounter for screening for human immunodeficiency virus [HIV]: Secondary | ICD-10-CM

## 2016-10-25 DIAGNOSIS — Z6841 Body Mass Index (BMI) 40.0 and over, adult: Secondary | ICD-10-CM | POA: Diagnosis not present

## 2016-10-25 DIAGNOSIS — N3281 Overactive bladder: Secondary | ICD-10-CM | POA: Insufficient documentation

## 2016-10-25 DIAGNOSIS — Z1159 Encounter for screening for other viral diseases: Secondary | ICD-10-CM

## 2016-10-25 DIAGNOSIS — M159 Polyosteoarthritis, unspecified: Secondary | ICD-10-CM | POA: Diagnosis not present

## 2016-10-25 DIAGNOSIS — M1990 Unspecified osteoarthritis, unspecified site: Secondary | ICD-10-CM | POA: Insufficient documentation

## 2016-10-25 LAB — MICROSCOPIC EXAMINATION
RBC MICROSCOPIC, UA: NONE SEEN /HPF (ref 0–?)
Renal Epithel, UA: NONE SEEN /hpf

## 2016-10-25 LAB — URINALYSIS, COMPLETE
Bilirubin, UA: NEGATIVE
GLUCOSE, UA: NEGATIVE
Ketones, UA: NEGATIVE
Leukocytes, UA: NEGATIVE
NITRITE UA: NEGATIVE
PH UA: 6.5 (ref 5.0–7.5)
RBC UA: NEGATIVE
SPEC GRAV UA: 1.02 (ref 1.005–1.030)
UUROB: 1 mg/dL (ref 0.2–1.0)

## 2016-10-25 MED ORDER — MIRABEGRON ER 50 MG PO TB24: 50.0000 mg | ORAL_TABLET | Freq: Every day | ORAL | 1 refills | Status: DC

## 2016-10-25 NOTE — Progress Notes (Signed)
   Subjective:    Patient ID: Eileen Arnold, female    DOB: Sep 12, 1960, 56 y.o.   MRN: [id number]  Pt presents to the office today for CPE without pap. PT currently being treated for OAB with Myrbetriq 25 mg. Pt states this is not helping and she continues to wake up 4-6 times a night. Denies any dysuria. She has more urge incontinence than stress incontinence.   PT also complaining of a "spot" on back her head that that been there for four years, but has become larger and slightly tender.  Arthritis  Presents for follow-up visit. Affected location: back. Her pain is at a severity of 8/10.      Review of Systems  Musculoskeletal: Positive for arthralgias, arthritis and back pain.  All other systems reviewed and are negative.      Objective:   Physical Exam  Constitutional: She is oriented to person, place, and time. She appears well-developed and well-nourished. No distress.  HENT:  Head: Normocephalic and atraumatic.  Right Ear: External ear normal.  Left Ear: External ear normal.  Nose: Nose normal.  Mouth/Throat: Oropharynx is clear and moist.  Eyes: Pupils are equal, round, and reactive to light.  Neck: Normal range of motion. Neck supple. No thyromegaly present.  Cardiovascular: Normal rate, regular rhythm, normal heart sounds and intact distal pulses.   No murmur heard. Pulmonary/Chest: Effort normal and breath sounds normal. No respiratory distress. She has no wheezes.  Abdominal: Soft. Bowel sounds are normal. She exhibits no distension. There is no tenderness.  Musculoskeletal: Normal range of motion. She exhibits no edema or tenderness.  Neurological: She is alert and oriented to person, place, and time. She has normal reflexes. No cranial nerve deficit.  Skin: Skin is warm and dry.  Psychiatric: She has a normal mood and affect. Her behavior is normal. Judgment and thought content normal.  Vitals reviewed.    BP 130/79   Pulse 84   Temp 97 F (36.1 C) (Oral)    Ht _0  (1.6 m)   Wt 262 lb (118.8 kg)   BMI 46.41 kg/m       Assessment & Plan:  1. Annual physical exam - Urinalysis, Complete - CMP14+EGFR - CBC with Differential/Platelet - Lipid panel - Thyroid Panel With TSH - VITAMIN D 25 Hydroxy (Vit-D Deficiency, Fractures) - HIV antibody - Hepatitis C antibody  2. Osteoarthritis of multiple joints, unspecified osteoarthritis type - CMP14+EGFR - CBC with Differential/Platelet  3. Overactive bladder -Myrbetriq increased to 50 mg from 25 mg Avoid caffeine  -If this does not improve will do referral to Urinologist  - mirabegron ER (MYRBETRIQ) 50 MG TB24 tablet; Take 1 tablet (50 mg total) by mouth daily.  Dispense: 90 tablet; Refill: 1 - CMP14+EGFR - CBC with Differential/Platelet  4. Encounter for hepatitis C screening test for low risk patient - CMP14+EGFR - CBC with Differential/Platelet - Hepatitis C antibody  5. Encounter for screening for HIV - CMP14+EGFR - CBC with Differential/Platelet - HIV antibody  6. Morbid obesity with BMI of 45.0-49.9, adult ([place])   Continue all meds Labs pending Health Maintenance reviewed Diet and exercise encouraged RTO 6 months   Evelina Dun, FNP

## 2016-10-25 NOTE — Patient Instructions (Signed)

## 2016-10-26 DIAGNOSIS — S92014A Nondisplaced fracture of body of right calcaneus, initial encounter for closed fracture: Secondary | ICD-10-CM | POA: Diagnosis not present

## 2016-10-26 DIAGNOSIS — M79671 Pain in right foot: Secondary | ICD-10-CM | POA: Diagnosis not present

## 2016-10-26 LAB — CBC WITH DIFFERENTIAL/PLATELET
BASOS ABS: 0 10*3/uL (ref 0.0–0.2)
Basos: 0 %
EOS (ABSOLUTE): 0 10*3/uL (ref 0.0–0.4)
EOS: 1 %
HEMATOCRIT: 38 % (ref 34.0–46.6)
Hemoglobin: 12.6 g/dL (ref 11.1–15.9)
IMMATURE GRANULOCYTES: 0 %
Immature Grans (Abs): 0 10*3/uL (ref 0.0–0.1)
LYMPHS ABS: 2.6 10*3/uL (ref 0.7–3.1)
Lymphs: 41 %
MCH: 31.2 pg (ref 26.6–33.0)
MCHC: 33.2 g/dL (ref 31.5–35.7)
MCV: 94 fL (ref 79–97)
MONOCYTES: 7 %
Monocytes Absolute: 0.5 10*3/uL (ref 0.1–0.9)
Neutrophils Absolute: 3.2 10*3/uL (ref 1.4–7.0)
Neutrophils: 51 %
Platelets: 229 10*3/uL (ref 150–379)
RBC: 4.04 x10E6/uL (ref 3.77–5.28)
RDW: 13.5 % (ref 12.3–15.4)
WBC: 6.4 10*3/uL (ref 3.4–10.8)

## 2016-10-26 LAB — THYROID PANEL WITH TSH
Free Thyroxine Index: 1.6 (ref 1.2–4.9)
T3 Uptake Ratio: 24 % (ref 24–39)
T4, Total: 6.5 ug/dL (ref 4.5–12.0)
TSH: 2.77 u[IU]/mL (ref 0.450–4.500)

## 2016-10-26 LAB — LIPID PANEL
CHOL/HDL RATIO: 4.1 ratio (ref 0.0–4.4)
CHOLESTEROL TOTAL: 215 mg/dL — AB (ref 100–199)
HDL: 53 mg/dL (ref >39)
LDL CALC: 135 mg/dL — AB (ref 0–99)
TRIGLYCERIDES: 134 mg/dL (ref 0–149)
VLDL Cholesterol Cal: 27 mg/dL (ref 5–40)

## 2016-10-26 LAB — CMP14+EGFR
ALBUMIN: 4.1 g/dL (ref 3.5–5.5)
ALK PHOS: 90 IU/L (ref 39–117)
ALT: 29 IU/L (ref 0–32)
AST: 24 IU/L (ref 0–40)
Albumin/Globulin Ratio: 1.7 (ref 1.2–2.2)
BUN / CREAT RATIO: 22 (ref 9–23)
BUN: 15 mg/dL (ref 6–24)
Bilirubin Total: 0.4 mg/dL (ref 0.0–1.2)
CO2: 27 mmol/L (ref 18–29)
CREATININE: 0.68 mg/dL (ref 0.57–1.00)
Calcium: 9.2 mg/dL (ref 8.7–10.2)
Chloride: 102 mmol/L (ref 96–106)
GFR calc Af Amer: 114 mL/min/{1.73_m2} (ref >59)
GFR calc non Af Amer: 99 mL/min/{1.73_m2} (ref >59)
GLUCOSE: 93 mg/dL (ref 65–99)
Globulin, Total: 2.4 g/dL (ref 1.5–4.5)
Potassium: 4.5 mmol/L (ref 3.5–5.2)
Sodium: 145 mmol/L — ABNORMAL HIGH (ref 134–144)
TOTAL PROTEIN: 6.5 g/dL (ref 6.0–8.5)

## 2016-10-26 LAB — HEPATITIS C ANTIBODY

## 2016-10-26 LAB — HIV ANTIBODY (ROUTINE TESTING W REFLEX): HIV SCREEN 4TH GENERATION: NONREACTIVE

## 2016-10-26 LAB — VITAMIN D 25 HYDROXY (VIT D DEFICIENCY, FRACTURES): Vit D, 25-Hydroxy: 45.4 ng/mL (ref 30.0–100.0)

## 2016-10-27 ENCOUNTER — Other Ambulatory Visit: Payer: Self-pay | Admitting: Family

## 2016-10-27 MED ORDER — PRAVASTATIN SODIUM 20 MG PO TABS: 20.0000 mg | ORAL_TABLET | Freq: Every day | ORAL | 3 refills | Status: DC

## 2016-11-04 DIAGNOSIS — L720 Epidermal cyst: Secondary | ICD-10-CM | POA: Diagnosis not present

## 2016-11-04 DIAGNOSIS — L820 Inflamed seborrheic keratosis: Secondary | ICD-10-CM | POA: Diagnosis not present

## 2016-11-18 DIAGNOSIS — L570 Actinic keratosis: Secondary | ICD-10-CM | POA: Diagnosis not present

## 2016-11-18 DIAGNOSIS — L720 Epidermal cyst: Secondary | ICD-10-CM | POA: Diagnosis not present

## 2016-11-18 DIAGNOSIS — L820 Inflamed seborrheic keratosis: Secondary | ICD-10-CM | POA: Diagnosis not present

## 2016-11-18 DIAGNOSIS — X32XXXD Exposure to sunlight, subsequent encounter: Secondary | ICD-10-CM | POA: Diagnosis not present

## 2016-11-30 DIAGNOSIS — S92014D Nondisplaced fracture of body of right calcaneus, subsequent encounter for fracture with routine healing: Secondary | ICD-10-CM | POA: Diagnosis not present

## 2016-11-30 DIAGNOSIS — M79671 Pain in right foot: Secondary | ICD-10-CM | POA: Diagnosis not present

## 2017-01-10 DIAGNOSIS — M79671 Pain in right foot: Secondary | ICD-10-CM | POA: Diagnosis not present

## 2017-01-10 DIAGNOSIS — S92014D Nondisplaced fracture of body of right calcaneus, subsequent encounter for fracture with routine healing: Secondary | ICD-10-CM | POA: Diagnosis not present

## 2017-01-13 DIAGNOSIS — L720 Epidermal cyst: Secondary | ICD-10-CM | POA: Diagnosis not present

## 2017-02-07 DIAGNOSIS — S92014D Nondisplaced fracture of body of right calcaneus, subsequent encounter for fracture with routine healing: Secondary | ICD-10-CM | POA: Diagnosis not present

## 2017-02-07 DIAGNOSIS — M79671 Pain in right foot: Secondary | ICD-10-CM | POA: Diagnosis not present

## 2017-03-21 ENCOUNTER — Ambulatory Visit (INDEPENDENT_AMBULATORY_CARE_PROVIDER_SITE_OTHER): Payer: Commercial Managed Care - HMO | Admitting: *Deleted

## 2017-03-21 DIAGNOSIS — Z23 Encounter for immunization: Secondary | ICD-10-CM | POA: Diagnosis not present

## 2017-03-21 DIAGNOSIS — Z1231 Encounter for screening mammogram for malignant neoplasm of breast: Secondary | ICD-10-CM | POA: Diagnosis not present

## 2017-03-27 ENCOUNTER — Other Ambulatory Visit: Payer: Self-pay | Admitting: Family Medicine

## 2017-04-01 ENCOUNTER — Other Ambulatory Visit: Payer: Self-pay | Admitting: Family Medicine

## 2017-04-04 DIAGNOSIS — S92014D Nondisplaced fracture of body of right calcaneus, subsequent encounter for fracture with routine healing: Secondary | ICD-10-CM | POA: Diagnosis not present

## 2017-04-04 DIAGNOSIS — M79671 Pain in right foot: Secondary | ICD-10-CM | POA: Diagnosis not present

## 2017-04-14 DIAGNOSIS — X32XXXD Exposure to sunlight, subsequent encounter: Secondary | ICD-10-CM | POA: Diagnosis not present

## 2017-04-14 DIAGNOSIS — L7211 Pilar cyst: Secondary | ICD-10-CM | POA: Diagnosis not present

## 2017-04-14 DIAGNOSIS — L570 Actinic keratosis: Secondary | ICD-10-CM | POA: Diagnosis not present

## 2017-04-18 ENCOUNTER — Other Ambulatory Visit: Payer: Self-pay | Admitting: Family

## 2017-04-18 DIAGNOSIS — N3281 Overactive bladder: Secondary | ICD-10-CM

## 2017-05-16 DIAGNOSIS — M79672 Pain in left foot: Secondary | ICD-10-CM | POA: Diagnosis not present

## 2017-05-16 DIAGNOSIS — M7732 Calcaneal spur, left foot: Secondary | ICD-10-CM | POA: Diagnosis not present

## 2017-05-16 DIAGNOSIS — M25579 Pain in unspecified ankle and joints of unspecified foot: Secondary | ICD-10-CM | POA: Diagnosis not present

## 2017-07-05 DIAGNOSIS — M79671 Pain in right foot: Secondary | ICD-10-CM | POA: Diagnosis not present

## 2017-07-05 DIAGNOSIS — M25579 Pain in unspecified ankle and joints of unspecified foot: Secondary | ICD-10-CM | POA: Diagnosis not present

## 2017-07-17 ENCOUNTER — Other Ambulatory Visit: Payer: Self-pay | Admitting: Family

## 2017-07-17 DIAGNOSIS — N3281 Overactive bladder: Secondary | ICD-10-CM

## 2017-07-18 NOTE — Telephone Encounter (Signed)
Last seen 10/25/16

## 2017-08-22 ENCOUNTER — Encounter: Payer: Self-pay | Admitting: Nurse Practitioner

## 2017-08-22 ENCOUNTER — Ambulatory Visit: Payer: 59 | Admitting: Nurse Practitioner

## 2017-08-22 VITALS — BP 140/81 | HR 80 | Temp 97.0°F | Ht 63.0 in | Wt 247.0 lb

## 2017-08-22 DIAGNOSIS — J0100 Acute maxillary sinusitis, unspecified: Secondary | ICD-10-CM

## 2017-08-22 DIAGNOSIS — N3281 Overactive bladder: Secondary | ICD-10-CM | POA: Diagnosis not present

## 2017-08-22 MED ORDER — FLUCONAZOLE 150 MG PO TABS: ORAL_TABLET | ORAL | 0 refills | Status: DC

## 2017-08-22 MED ORDER — MIRABEGRON ER 50 MG PO TB24: 50.0000 mg | ORAL_TABLET | Freq: Every day | ORAL | 0 refills | Status: DC

## 2017-08-22 MED ORDER — AMOXICILLIN-POT CLAVULANATE 875-125 MG PO TABS: 1.0000 | ORAL_TABLET | Freq: Two times a day (BID) | ORAL | 0 refills | Status: DC

## 2017-08-22 NOTE — Patient Instructions (Signed)

## 2017-08-22 NOTE — Progress Notes (Signed)
Subjective:    Patient ID: Eileen Arnold, female    DOB: [birth year], 57 y.o.   MRN: [id number]  HPI Patient in today c/o sinus pressure in face , cough and congestion. Started 3 days ago. She is leaving for a cruise on Saturday and wants to be better.  * needs refill on myrbetriq  Review of Systems  Constitutional: Positive for fatigue and fever. Negative for activity change and appetite change.  HENT: Positive for congestion, sinus pressure and sinus pain. Negative for sore throat and trouble swallowing.   Eyes: Negative for pain.  Respiratory: Positive for cough (slight). Negative for shortness of breath.   Cardiovascular: Negative for chest pain, palpitations and leg swelling.  Gastrointestinal: Negative for abdominal pain.  Endocrine: Negative for polydipsia.  Genitourinary: Negative.   Skin: Negative for rash.  Neurological: Negative for dizziness, weakness and headaches.  Hematological: Does not bruise/bleed easily.  Psychiatric/Behavioral: Negative.   All other systems reviewed and are negative.      Objective:   Physical Exam  Constitutional: She is oriented to person, place, and time. She appears well-developed and well-nourished. No distress.  HENT:  Right Ear: Hearing, tympanic membrane, external ear and ear canal normal.  Left Ear: Hearing, tympanic membrane, external ear and ear canal normal.  Nose: Mucosal edema and rhinorrhea present. Right sinus exhibits maxillary sinus tenderness. Right sinus exhibits no frontal sinus tenderness. Left sinus exhibits maxillary sinus tenderness. Left sinus exhibits no frontal sinus tenderness.  Mouth/Throat: Uvula is midline, oropharynx is clear and moist and mucous membranes are normal.  Neck: Normal range of motion. Neck supple.  Cardiovascular: Normal rate and regular rhythm.  Pulmonary/Chest: Effort normal and breath sounds normal.  Abdominal: Soft. Bowel sounds are normal.  Lymphadenopathy:    She has no cervical  adenopathy.  Neurological: She is alert and oriented to person, place, and time.  Skin: Skin is warm and dry.  Psychiatric: She has a normal mood and affect. Her behavior is normal. Judgment and thought content normal.   BP 140/81   Pulse 80   Temp (!) 97 F (36.1 C) (Oral)   Ht 5\' 3"  (1.6 m)   Wt 247 lb (112 kg)   BMI 43.75 kg/m       Assessment & Plan:  1. Acute maxillary sinusitis, recurrence not specified 1. Take meds as prescribed 2. Use a cool mist humidifier especially during the winter months and when heat has been humid. 3. Use saline nose sprays frequently 4. Saline irrigations of the nose can be very helpful if done frequently.  * 4X daily for 1 week*  * Use of a nettie pot can be helpful with this. Follow directions with this* 5. Drink plenty of fluids 6. Keep thermostat turn down low 7.For any cough or congestion  Use plain Mucinex- regular strength or max strength is fine   * Children- consult with Pharmacist for dosing 8. For fever or aces or pains- take tylenol or ibuprofen appropriate for age and weight.  * for fevers greater than 101 orally you may alternate ibuprofen and tylenol every  3 hours.    - amoxicillin-clavulanate (AUGMENTIN) 875-125 MG tablet; Take 1 tablet by mouth 2 (two) times daily.  Dispense: 4 tablet; Refill: 0 - fluconazole (DIFLUCAN) 150 MG tablet; 1 po q week x 4 weeks  Dispense: 4 tablet; Refill: 0  2. Overactive bladder - mirabegron ER (MYRBETRIQ) 50 MG TB24 tablet; Take 1 tablet (50 mg total) by mouth daily.  Dispense:  90 tablet; Refill: 0  Mary-Margaret Hassell Done, FNP

## 2017-08-23 ENCOUNTER — Other Ambulatory Visit: Payer: Self-pay

## 2017-08-23 DIAGNOSIS — J0100 Acute maxillary sinusitis, unspecified: Secondary | ICD-10-CM

## 2017-08-23 MED ORDER — AMOXICILLIN-POT CLAVULANATE 875-125 MG PO TABS: 1.0000 | ORAL_TABLET | Freq: Two times a day (BID) | ORAL | 0 refills | Status: DC

## 2017-09-19 DIAGNOSIS — M722 Plantar fascial fibromatosis: Secondary | ICD-10-CM | POA: Diagnosis not present

## 2017-09-19 DIAGNOSIS — M25579 Pain in unspecified ankle and joints of unspecified foot: Secondary | ICD-10-CM | POA: Diagnosis not present

## 2017-09-19 DIAGNOSIS — M79672 Pain in left foot: Secondary | ICD-10-CM | POA: Diagnosis not present

## 2017-09-28 DIAGNOSIS — M79672 Pain in left foot: Secondary | ICD-10-CM | POA: Diagnosis not present

## 2017-09-28 DIAGNOSIS — M722 Plantar fascial fibromatosis: Secondary | ICD-10-CM | POA: Diagnosis not present

## 2017-12-20 ENCOUNTER — Other Ambulatory Visit: Payer: Self-pay | Admitting: Nurse Practitioner

## 2017-12-20 DIAGNOSIS — N3281 Overactive bladder: Secondary | ICD-10-CM

## 2017-12-20 NOTE — Telephone Encounter (Signed)
Last seen 08/22/17

## 2018-01-10 DIAGNOSIS — M79672 Pain in left foot: Secondary | ICD-10-CM | POA: Diagnosis not present

## 2018-01-10 DIAGNOSIS — M722 Plantar fascial fibromatosis: Secondary | ICD-10-CM | POA: Diagnosis not present

## 2018-03-18 ENCOUNTER — Other Ambulatory Visit: Payer: Self-pay | Admitting: Nurse Practitioner

## 2018-03-19 ENCOUNTER — Other Ambulatory Visit: Payer: Self-pay | Admitting: Nurse Practitioner

## 2018-03-19 DIAGNOSIS — N3281 Overactive bladder: Secondary | ICD-10-CM

## 2018-03-20 NOTE — Telephone Encounter (Signed)
Last seen 08/22/17  MMM

## 2018-03-23 DIAGNOSIS — Z23 Encounter for immunization: Secondary | ICD-10-CM | POA: Diagnosis not present

## 2018-04-05 ENCOUNTER — Encounter: Payer: Self-pay | Admitting: Family Medicine

## 2018-04-05 ENCOUNTER — Ambulatory Visit: Payer: 59 | Admitting: Family Medicine

## 2018-04-05 VITALS — BP 148/72 | HR 76 | Temp 97.5°F | Ht 63.0 in | Wt 256.0 lb

## 2018-04-05 DIAGNOSIS — M159 Polyosteoarthritis, unspecified: Secondary | ICD-10-CM | POA: Diagnosis not present

## 2018-04-05 DIAGNOSIS — J069 Acute upper respiratory infection, unspecified: Secondary | ICD-10-CM | POA: Diagnosis not present

## 2018-04-05 DIAGNOSIS — N3281 Overactive bladder: Secondary | ICD-10-CM

## 2018-04-05 DIAGNOSIS — H6591 Unspecified nonsuppurative otitis media, right ear: Secondary | ICD-10-CM

## 2018-04-05 MED ORDER — CELECOXIB 200 MG PO CAPS: 200.0000 mg | ORAL_CAPSULE | Freq: Every day | ORAL | 0 refills | Status: DC

## 2018-04-05 MED ORDER — METHYLPREDNISOLONE ACETATE 40 MG/ML IJ SUSP
40.0000 mg | Freq: Once | INTRAMUSCULAR | Status: AC
  Administered 2018-04-05: 40 mg via INTRAMUSCULAR

## 2018-04-05 MED ORDER — MIRABEGRON ER 50 MG PO TB24: 50.0000 mg | ORAL_TABLET | Freq: Every day | ORAL | 0 refills | Status: DC

## 2018-04-05 MED ORDER — FLUTICASONE PROPIONATE 50 MCG/ACT NA SUSP: 2.0000 | Freq: Every day | NASAL | 6 refills | Status: DC

## 2018-04-05 MED ORDER — AZITHROMYCIN 250 MG PO TABS: ORAL_TABLET | ORAL | 0 refills | Status: DC

## 2018-04-05 MED ORDER — CETIRIZINE HCL 10 MG PO TABS: 10.0000 mg | ORAL_TABLET | Freq: Every day | ORAL | 11 refills | Status: DC

## 2018-04-05 NOTE — Progress Notes (Addendum)
Subjective:    Patient ID: Eileen Arnold, female    DOB: 1960/12/03, 58 y.o.   MRN: [id number]  Chief Complaint:  Chest congestion, cough (x 2 weeks)   HPI: Eileen Arnold is a 57 y.o. female presenting on 04/05/2018 for Chest congestion, cough (x 2 weeks)  Pt presents today for cough, congestion, fatigue, and rhinorrhea. Pt states this started over 2 weeks ago and has been ongoing. States her cough and congestion are getting worse. Pt states the cough is worse at night and is productive. Pt states she has been taking over the counter decongestants, cough medicines, and zyrtec without relief of symptoms. Pt also reports she is out of her Myrbetriq and Celebrex. States she has been taking these medications as prescribed and denies adverse side effects. She reports her symptoms are well controlled.   Relevant past medical, surgical, family, and social history reviewed and updated as indicated.  Allergies and medications reviewed and updated.   Past Medical History:  Diagnosis Date  . Family hx of colon cancer     Past Surgical History:  Procedure Laterality Date  . ABDOMINAL HYSTERECTOMY    . APPENDECTOMY    . BREAST REDUCTION SURGERY Bilateral   . BUNIONECTOMY Right   . CHOLECYSTECTOMY    . COLONOSCOPY N/A 12/13/2014   Procedure: COLONOSCOPY;  Surgeon: Rogene Houston, MD;  Location: [facility];  Service: Endoscopy;  Laterality: N/A;  830 - moved to 9:15 - Ann to notify pt  . OOPHORECTOMY Right 2011    Social History   Socioeconomic History  . Marital status: Married    Spouse name: Not on file  . Number of children: Not on file  . Years of education: Not on file  . Highest education level: Not on file  Occupational History  . Not on file  Social Needs  . Financial resource strain: Not on file  . Food insecurity:    Worry: Not on file    Inability: Not on file  . Transportation needs:    Medical: Not on file    Non-medical: Not on file  Tobacco Use  .  Smoking status: Former Smoker    Packs/day: 1.00    Years: 38.00    Pack years: 38.00    Last attempt to quit: 07/14/2014    Years since quitting: 3.7  . Smokeless tobacco: Never Used  Substance and Sexual Activity  . Alcohol use: Yes    Comment: occasionally  . Drug use: No  . Sexual activity: Yes  Lifestyle  . Physical activity:    Days per week: Not on file    Minutes per session: Not on file  . Stress: Not on file  Relationships  . Social connections:    Talks on phone: Not on file    Gets together: Not on file    Attends religious service: Not on file    Active member of club or organization: Not on file    Attends meetings of clubs or organizations: Not on file    Relationship status: Not on file  . Intimate partner violence:    Fear of current or ex partner: Not on file    Emotionally abused: Not on file    Physically abused: Not on file    Forced sexual activity: Not on file  Other Topics Concern  . Not on file  Social History Narrative  . Not on file    Outpatient Encounter Medications as of 04/05/2018  Medication Sig  . celecoxib (CELEBREX) 200 MG capsule Take 1 capsule (200 mg total) by mouth daily.  . mirabegron ER (MYRBETRIQ) 50 MG TB24 tablet Take 1 tablet (50 mg total) by mouth daily.  . Multiple Vitamins-Minerals (HAIR/SKIN/NAILS PO) Take 1 tablet by mouth daily.  . Omega-3 Fatty Acids (FISH OIL) 1200 MG CAPS Take 1,200 mg by mouth.  . [DISCONTINUED] celecoxib (CELEBREX) 200 MG capsule TAKE 1 CAPSULE (200 MG TOTAL) BY MOUTH DAILY.  . [DISCONTINUED] celecoxib (CELEBREX) 200 MG capsule Take 1 capsule (200 mg total) by mouth daily.  . [DISCONTINUED] mirabegron ER (MYRBETRIQ) 50 MG TB24 tablet Take 1 tablet (50 mg total) by mouth daily.  . [DISCONTINUED] MYRBETRIQ 50 MG TB24 tablet TAKE 1 TABLET BY MOUTH EVERY DAY  . azithromycin (ZITHROMAX Z-PAK) 250 MG tablet As directed  . cetirizine (ZYRTEC) 10 MG tablet Take 1 tablet (10 mg total) by mouth daily.  .  Cholecalciferol (VITAMIN D3) 2000 units TABS Take 2,000 Units by mouth.  . fluticasone (FLONASE) 50 MCG/ACT nasal spray Place 2 sprays into both nostrils daily.  . pravastatin (PRAVACHOL) 20 MG tablet Take 1 tablet (20 mg total) by mouth daily. (Patient not taking: Reported on 04/05/2018)  . [DISCONTINUED] amoxicillin-clavulanate (AUGMENTIN) 875-125 MG tablet Take 1 tablet by mouth 2 (two) times daily.  . [DISCONTINUED] celecoxib (CELEBREX) 200 MG capsule TAKE 1 CAPSULE BY MOUTH EVERY DAY  . [DISCONTINUED] fluconazole (DIFLUCAN) 150 MG tablet 1 po q week x 4 weeks   Facility-Administered Encounter Medications as of 04/05/2018  Medication  . methylPREDNISolone acetate (DEPO-MEDROL) injection 40 mg    No Known Allergies  Review of Systems  Constitutional: Positive for chills and fatigue. Negative for fever.  HENT: Positive for congestion, postnasal drip, rhinorrhea and sore throat.   Respiratory: Positive for cough. Negative for shortness of breath.   Cardiovascular: Negative for chest pain and palpitations.  Gastrointestinal: Negative for abdominal pain, nausea and vomiting.  Genitourinary: Positive for enuresis. Negative for decreased urine volume, difficulty urinating, dysuria, frequency and hematuria.  Musculoskeletal: Positive for arthralgias (mild, intermittent ).  Neurological: Negative for dizziness, weakness, numbness and headaches.  All other systems reviewed and are negative.       Objective:    BP (!) 148/72 (BP Location: Right Arm, Cuff Size: Normal)   Pulse 76   Temp (!) 97.5 F (36.4 C) (Oral)   Ht 5\' 3"  (1.6 m)   Wt 256 lb (116.1 kg)   BMI 45.35 kg/m    Wt Readings from Last 3 Encounters:  04/05/18 256 lb (116.1 kg)  08/22/17 247 lb (112 kg)  10/25/16 262 lb (118.8 kg)    Physical Exam  Constitutional: She is oriented to person, place, and time. She appears well-developed and well-nourished. She is cooperative. No distress.  HENT:  Head: Normocephalic and  atraumatic.  Right Ear: Hearing, external ear and ear canal normal. Tympanic membrane is not injected, not perforated and not erythematous. A middle ear effusion is present.  Left Ear: Hearing, tympanic membrane, external ear and ear canal normal.  Nose: Rhinorrhea present. Right sinus exhibits no maxillary sinus tenderness and no frontal sinus tenderness. Left sinus exhibits no maxillary sinus tenderness and no frontal sinus tenderness.  Mouth/Throat: Uvula is midline and mucous membranes are normal. Posterior oropharyngeal erythema present. No oropharyngeal exudate, posterior oropharyngeal edema or tonsillar abscesses. No tonsillar exudate.  Eyes: Pupils are equal, round, and reactive to light. Conjunctivae, EOM and lids are normal.  Neck: Trachea normal and phonation  normal.  Cardiovascular: Normal rate, regular rhythm and normal heart sounds. Exam reveals no gallop and no friction rub.  No murmur heard. Pulmonary/Chest: Effort normal. No respiratory distress. She has rhonchi (mild) in the right upper field and the left upper field.  Lymphadenopathy:       Head (right side): Submandibular adenopathy present.       Head (left side): Submandibular adenopathy present.  Neurological: She is alert and oriented to person, place, and time.  Skin: Skin is warm and dry. Capillary refill takes less than 2 seconds.  Psychiatric: She has a normal mood and affect. Her behavior is normal. Judgment and thought content normal.  Nursing note and vitals reviewed.        Pertinent labs & imaging results that were available during my care of the patient were reviewed by me and considered in my medical decision making.  Assessment & Plan:  Rashana was seen today for chest congestion, cough.  Diagnoses and all orders for this visit:  Upper respiratory infection with cough and congestion Increase fluid intake. Increase humidity in the air. Tylenol as needed for fever control. Sugar free candy or throat  lozenges as needed. Hot tea with honey. Medications as prescribed.  -     fluticasone (FLONASE) 50 MCG/ACT nasal spray; Place 2 sprays into both nostrils daily. -     azithromycin (ZITHROMAX Z-PAK) 250 MG tablet; As directed  Overactive bladder -     mirabegron ER (MYRBETRIQ) 50 MG TB24 tablet; Take 1 tablet (50 mg total) by mouth daily.  Osteoarthritis of multiple joints, unspecified osteoarthritis type -     celecoxib (CELEBREX) 200 MG capsule; Take 1 capsule (200 mg total) by mouth daily.  Right otitis media with effusion -     fluticasone (FLONASE) 50 MCG/ACT nasal spray; Place 2 sprays into both nostrils daily. -     methylPREDNISolone acetate (DEPO-MEDROL) injection 40 mg -     cetirizine (ZYRTEC) 10 MG tablet; Take 1 tablet (10 mg total) by mouth daily.     Continue all other maintenance medications.  Follow up plan: Return if symptoms worsen or fail to improve.  Schedule an appointment for you annual physical exam.   Educational handout given for URI  The above assessment and management plan was discussed with the patient. The patient verbalized understanding of and has agreed to the management plan. Patient is aware to call the clinic if symptoms persist or worsen. Patient is aware when to return to the clinic for a follow-up visit. Patient educated on when it is appropriate to go to the emergency department.   Monia Pouch, FNP-C [facility] [phone]

## 2018-04-05 NOTE — Patient Instructions (Signed)
Upper Respiratory Infection, Adult Most upper respiratory infections (URIs) are caused by a virus. A URI affects the nose, throat, and upper air passages. The most common type of URI is often called "the common cold." Follow these instructions at home:  Take medicines only as told by your doctor.  Gargle warm saltwater or take cough drops to comfort your throat as told by your doctor.  Use a warm mist humidifier or inhale steam from a shower to increase air moisture. This may make it easier to breathe.  Drink enough fluid to keep your pee (urine) clear or pale yellow.  Eat soups and other clear broths.  Have a healthy diet.  Rest as needed.  Go back to work when your fever is gone or your doctor says it is okay. ? You may need to stay home longer to avoid giving your URI to others. ? You can also wear a face mask and wash your hands often to prevent spread of the virus.  Use your inhaler more if you have asthma.  Do not use any tobacco products, including cigarettes, chewing tobacco, or electronic cigarettes. If you need help quitting, ask your doctor. Contact a doctor if:  You are getting worse, not better.  Your symptoms are not helped by medicine.  You have chills.  You are getting more short of breath.  You have brown or red mucus.  You have yellow or brown discharge from your nose.  You have pain in your face, especially when you bend forward.  You have a fever.  You have puffy (swollen) neck glands.  You have pain while swallowing.  You have white areas in the back of your throat. Get help right away if:  You have very bad or constant: ? Headache. ? Ear pain. ? Pain in your forehead, behind your eyes, and over your cheekbones (sinus pain). ? Chest pain.  You have long-lasting (chronic) lung disease and any of the following: ? Wheezing. ? Long-lasting cough. ? Coughing up blood. ? A change in your usual mucus.  You have a stiff neck.  You have  changes in your: ? Vision. ? Hearing. ? Thinking. ? Mood. This information is not intended to replace advice given to you by your health care provider. Make sure you discuss any questions you have with your health care provider. Document Released: 12/01/2007 Document Revised: 02/15/2016 Document Reviewed: 09/19/2013 Elsevier Interactive Patient Education  2018 Elsevier Inc.  

## 2018-05-02 ENCOUNTER — Encounter (INDEPENDENT_AMBULATORY_CARE_PROVIDER_SITE_OTHER): Payer: Self-pay | Admitting: *Deleted

## 2018-05-02 ENCOUNTER — Telehealth (INDEPENDENT_AMBULATORY_CARE_PROVIDER_SITE_OTHER): Payer: Self-pay | Admitting: *Deleted

## 2018-05-02 ENCOUNTER — Encounter (INDEPENDENT_AMBULATORY_CARE_PROVIDER_SITE_OTHER): Payer: Self-pay | Admitting: Internal Medicine

## 2018-05-02 ENCOUNTER — Ambulatory Visit (INDEPENDENT_AMBULATORY_CARE_PROVIDER_SITE_OTHER): Payer: 59 | Admitting: Internal Medicine

## 2018-05-02 VITALS — BP 180/90 | HR 80 | Temp 97.6°F | Ht 63.0 in | Wt 259.2 lb

## 2018-05-02 DIAGNOSIS — Z860100 Personal history of colon polyps, unspecified: Secondary | ICD-10-CM | POA: Insufficient documentation

## 2018-05-02 DIAGNOSIS — Z800 Family history of malignant neoplasm of digestive organs: Secondary | ICD-10-CM

## 2018-05-02 DIAGNOSIS — K625 Hemorrhage of anus and rectum: Secondary | ICD-10-CM | POA: Insufficient documentation

## 2018-05-02 MED ORDER — SUPREP BOWEL PREP KIT 17.5-3.13-1.6 GM/177ML PO SOLN: 1.0000 | Freq: Once | ORAL | 0 refills | Status: AC

## 2018-05-02 NOTE — Progress Notes (Signed)
Subjective:    Patient ID: Eileen Arnold, female    DOB: March 14, 1961, 57 y.o.   MRN: [id number]  HPI Presents today with c/o rectal bleeding. States she saw blood in the commode yesterday. She had one episode. She was straining to have a BM. Her BM was not hard. She shows me a picture of stool with blood in it.  Has not seen any blood since. Appetite is good. No weight loss. Has a BM 2-3 times a day.  Her last colonoscopy was in 2016 by Dr. Laural Golden (see below). Family hx of colon cancer in a sister who was diagnosed at age 50 and is dong good.    12/13/2014 Colonoscopy with snare polypectomy and clip application.  Indications: Patient is 57 year old Caucasian female was history of colonic polyps and family history of colon carcinoma in her sister who was diagnosed at 92 and doing fine 9 years later. Impression:  Examination performed to cecum. Small cecal polyp coagulated with snare tip Two small polyps were cold snared and submitted together(cecum and transverse colon). 10 mm broad-based polyp hot snared from transverse colon and submitted separately. Polypectomy site was felt to be deep in 3 resolution clips applied to polypectomy site. Scattered diverticula at sigmoid colon. Small external hemorrhoids  Biopsy results reviewed with patient. 2 small polyps removed one is a tubular adenoma and the other one is sessile serrated polyp. 10 mm polyp at transverse colon is sessile serrated. Report to PCP. Colonoscopy in 5 years  Review of Systems Past Medical History:  Diagnosis Date  . Family hx of colon cancer     Past Surgical History:  Procedure Laterality Date  . ABDOMINAL HYSTERECTOMY    . APPENDECTOMY    . BREAST REDUCTION SURGERY Bilateral   . BUNIONECTOMY Right   . CHOLECYSTECTOMY    . COLONOSCOPY N/A 12/13/2014   Procedure: COLONOSCOPY;  Surgeon: Rogene Houston, MD;  Location: [facility];  Service: Endoscopy;  Laterality: N/A;  830 - moved to 9:15 - Ann to  notify pt  . OOPHORECTOMY Right 2011    No Known Allergies  Current Outpatient Medications on File Prior to Visit  Medication Sig Dispense Refill  . celecoxib (CELEBREX) 200 MG capsule Take 1 capsule (200 mg total) by mouth daily. 90 capsule 0  . cetirizine (ZYRTEC) 10 MG tablet Take 1 tablet (10 mg total) by mouth daily. 30 tablet 11  . Cholecalciferol (VITAMIN D3) 2000 units TABS Take 2,000 Units by mouth.    . fluticasone (FLONASE) 50 MCG/ACT nasal spray Place 2 sprays into both nostrils daily. (Patient taking differently: Place into both nostrils as needed. ) 16 g 6  . mirabegron ER (MYRBETRIQ) 50 MG TB24 tablet Take 1 tablet (50 mg total) by mouth daily. 90 tablet 0  . Multiple Vitamins-Minerals (HAIR/SKIN/NAILS PO) Take 1 tablet by mouth daily.    . Omega-3 Fatty Acids (FISH OIL) 1200 MG CAPS Take 1,200 mg by mouth.    . pravastatin (PRAVACHOL) 20 MG tablet Take 1 tablet (20 mg total) by mouth daily. 90 tablet 3  . saccharomyces boulardii (FLORASTOR) 250 MG capsule Take 250 mg by mouth 2 (two) times daily.     No current facility-administered medications on file prior to visit.         Objective:   Physical Exam Blood pressure (!) 180/90, pulse 80, temperature 97.6 F (36.4 C), height 5\' 3"  (1.6 m), weight 259 lb 3.2 oz (117.6 kg).  Alert and oriented. Skin warm  and dry. Oral mucosa is moist.   . Sclera anicteric, conjunctivae is pink. Thyroid not enlarged. No cervical lymphadenopathy. Lungs clear. Heart regular rate and rhythm.  Abdomen is soft. Bowel sounds are positive. No hepatomegaly. No abdominal masses felt. No tenderness.  No edema to lower extremities.         Assessment & Plan:  Rectal bleeding, Hx of colon polyps, Family hx of colon cancer. Colonic neoplasm needs to be ruled out.

## 2018-05-02 NOTE — Telephone Encounter (Signed)
Patient needs suprep 

## 2018-05-02 NOTE — Patient Instructions (Signed)
The risks of bleeding, perforation and infection were reviewed with patient.  

## 2018-05-22 ENCOUNTER — Encounter (HOSPITAL_COMMUNITY): Payer: Self-pay | Admitting: *Deleted

## 2018-05-22 ENCOUNTER — Other Ambulatory Visit: Payer: Self-pay

## 2018-05-22 ENCOUNTER — Encounter (HOSPITAL_COMMUNITY)
Admission: RE | Disposition: A | Discharge status: Home / Self Care | Payer: Self-pay | Source: Ambulatory Visit | Attending: Internal Medicine

## 2018-05-22 ENCOUNTER — Ambulatory Visit (HOSPITAL_COMMUNITY)
Admission: RE | Admit: 2018-05-22 | Discharge: 2018-05-22 | Disposition: A | Discharge status: Home / Self Care | Payer: 59 | Source: Ambulatory Visit | Attending: Internal Medicine | Admitting: Internal Medicine

## 2018-05-22 DIAGNOSIS — Z841 Family history of disorders of kidney and ureter: Secondary | ICD-10-CM | POA: Diagnosis not present

## 2018-05-22 DIAGNOSIS — Z823 Family history of stroke: Secondary | ICD-10-CM | POA: Diagnosis not present

## 2018-05-22 DIAGNOSIS — Z90710 Acquired absence of both cervix and uterus: Secondary | ICD-10-CM | POA: Insufficient documentation

## 2018-05-22 DIAGNOSIS — N3281 Overactive bladder: Secondary | ICD-10-CM | POA: Diagnosis not present

## 2018-05-22 DIAGNOSIS — M479 Spondylosis, unspecified: Secondary | ICD-10-CM | POA: Insufficient documentation

## 2018-05-22 DIAGNOSIS — Z809 Family history of malignant neoplasm, unspecified: Secondary | ICD-10-CM | POA: Diagnosis not present

## 2018-05-22 DIAGNOSIS — K625 Hemorrhage of anus and rectum: Secondary | ICD-10-CM

## 2018-05-22 DIAGNOSIS — Z79899 Other long term (current) drug therapy: Secondary | ICD-10-CM | POA: Insufficient documentation

## 2018-05-22 DIAGNOSIS — K644 Residual hemorrhoidal skin tags: Secondary | ICD-10-CM | POA: Insufficient documentation

## 2018-05-22 DIAGNOSIS — Z860100 Personal history of colon polyps, unspecified: Secondary | ICD-10-CM

## 2018-05-22 DIAGNOSIS — E669 Obesity, unspecified: Secondary | ICD-10-CM | POA: Insufficient documentation

## 2018-05-22 DIAGNOSIS — K5730 Diverticulosis of large intestine without perforation or abscess without bleeding: Secondary | ICD-10-CM

## 2018-05-22 DIAGNOSIS — Z9049 Acquired absence of other specified parts of digestive tract: Secondary | ICD-10-CM | POA: Insufficient documentation

## 2018-05-22 DIAGNOSIS — E785 Hyperlipidemia, unspecified: Secondary | ICD-10-CM | POA: Insufficient documentation

## 2018-05-22 DIAGNOSIS — Z820 Family history of epilepsy and other diseases of the nervous system: Secondary | ICD-10-CM | POA: Diagnosis not present

## 2018-05-22 DIAGNOSIS — Z87891 Personal history of nicotine dependence: Secondary | ICD-10-CM | POA: Diagnosis not present

## 2018-05-22 DIAGNOSIS — Z09 Encounter for follow-up examination after completed treatment for conditions other than malignant neoplasm: Secondary | ICD-10-CM | POA: Diagnosis not present

## 2018-05-22 DIAGNOSIS — Z800 Family history of malignant neoplasm of digestive organs: Secondary | ICD-10-CM | POA: Diagnosis not present

## 2018-05-22 DIAGNOSIS — Z1211 Encounter for screening for malignant neoplasm of colon: Secondary | ICD-10-CM | POA: Insufficient documentation

## 2018-05-22 DIAGNOSIS — Z8249 Family history of ischemic heart disease and other diseases of the circulatory system: Secondary | ICD-10-CM | POA: Insufficient documentation

## 2018-05-22 HISTORY — PX: COLONOSCOPY: SHX5424

## 2018-05-22 SURGERY — COLONOSCOPY
Anesthesia: Moderate Sedation

## 2018-05-22 MED ORDER — MIDAZOLAM HCL 5 MG/5ML IJ SOLN
INTRAMUSCULAR | Status: DC | PRN
  Administered 2018-05-22: 2 mg via INTRAVENOUS
  Administered 2018-05-22: 3 mg via INTRAVENOUS

## 2018-05-22 MED ORDER — MIDAZOLAM HCL 5 MG/5ML IJ SOLN
INTRAMUSCULAR | Status: AC
  Filled 2018-05-22: qty 10

## 2018-05-22 MED ORDER — SODIUM CHLORIDE 0.9 % IV SOLN
INTRAVENOUS | Status: DC
  Administered 2018-05-22: 13:00:00 via INTRAVENOUS

## 2018-05-22 MED ORDER — MEPERIDINE HCL 50 MG/ML IJ SOLN
INTRAMUSCULAR | Status: AC
  Filled 2018-05-22: qty 1

## 2018-05-22 MED ORDER — STERILE WATER FOR IRRIGATION IR SOLN
Status: DC | PRN
  Administered 2018-05-22: 14:00:00

## 2018-05-22 MED ORDER — MEPERIDINE HCL 50 MG/ML IJ SOLN
INTRAMUSCULAR | Status: DC | PRN
  Administered 2018-05-22 (×2): 25 mg via INTRAVENOUS

## 2018-05-22 NOTE — Op Note (Signed)
[facility] Patient Name: Eileen Arnold Procedure Date: 05/22/2018 1:47 PM MRN: [id number] Date of Birth: [birth year] Attending MD: Hildred Laser , MD CSN: [id number] Age: 57 Admit Type: Outpatient Procedure:                Colonoscopy Indications:              High risk colon cancer surveillance: Personal                            history of colonic polyps Providers:                Hildred Laser, MD, Otis Peak B. Sharon Seller, RN, Nelma Rothman, Technician Referring MD:             [name] FNP Medicines:                Meperidine 50 mg IV, Midazolam 5 mg IV Complications:            No immediate complications. Estimated Blood Loss:     Estimated blood loss: none. Procedure:                Pre-Anesthesia Assessment:                           - Prior to the procedure, a History and Physical                            was performed, and patient medications and                            allergies were reviewed. The patient's tolerance of                            previous anesthesia was also reviewed. The risks                            and benefits of the procedure and the sedation                            options and risks were discussed with the patient.                            All questions were answered, and informed consent                            was obtained. Prior Anticoagulants: The patient                            last took Celebrex (celecoxib) on the day of the                            procedure. ASA Grade Assessment: II - A patient  with mild systemic disease. After reviewing the                            risks and benefits, the patient was deemed in                            satisfactory condition to undergo the procedure.                           After obtaining informed consent, the colonoscope                            was passed under direct vision. Throughout the   procedure, the patient's blood pressure, pulse, and                            oxygen saturations were monitored continuously. The                            [id number]-[id number] ([id number]) scope was introduced through                            the anus and advanced to the the cecum, identified                            by appendiceal orifice and ileocecal valve. The                            colonoscopy was performed without difficulty. The                            patient tolerated the procedure well. The quality                            of the bowel preparation was adequate. The                            ileocecal valve, appendiceal orifice, and rectum                            were photographed. Scope In: 2:03:11 PM Scope Out: 2:29:21 PM Scope Withdrawal Time: 0 hours 12 minutes 14 seconds  Total Procedure Duration: 0 hours 26 minutes 10 seconds  Findings:      The perianal and digital rectal examinations were normal.      Scattered medium-mouthed diverticula were found in the sigmoid colon.      The exam was otherwise normal throughout the examined colon.      External hemorrhoids were found during retroflexion. The hemorrhoids       were small. Impression:               - Diverticulosis in the sigmoid colon.                           - External hemorrhoids.                           -  No specimens collected. Moderate Sedation:      Moderate (conscious) sedation was administered by the endoscopy nurse       and supervised by the endoscopist. The following parameters were       monitored: oxygen saturation, heart rate, blood pressure, CO2       capnography and response to care. Total physician intraservice time was       33 minutes. Recommendation:           - Patient has a contact number available for                            emergencies. The signs and symptoms of potential                            delayed complications were discussed with the                             patient. Return to normal activities tomorrow.                            Written discharge instructions were provided to the                            patient.                           - High fiber diet today.                           - Continue present medications.                           - Repeat colonoscopy in 5 years for surveillance. Procedure Code(s):        --- Professional ---                           [phone], Colonoscopy, flexible; diagnostic, including                            collection of specimen(s) by brushing or washing,                            when performed (separate procedure)                           99153, Moderate sedation; each additional 15                            minutes intraservice time                           G0500, Moderate sedation services provided by the                            same physician or other qualified health care  professional performing a gastrointestinal                            endoscopic service that sedation supports,                            requiring the presence of an independent trained                            observer to assist in the monitoring of the                            patient's level of consciousness and physiological                            status; initial 15 minutes of intra-service time;                            patient age 30 years or older (additional time may                            be reported with [phone], as appropriate) Diagnosis Code(s):        --- Professional ---                           Z86.010, Personal history of colonic polyps                           K64.4, Residual hemorrhoidal skin tags                           K57.30, Diverticulosis of large intestine without                            perforation or abscess without bleeding CPT copyright 2018 American Medical Association. All rights reserved. The codes documented in this report are preliminary and  upon coder review may  be revised to meet current compliance requirements. Hildred Laser, MD Hildred Laser, MD 05/22/2018 2:40:46 PM This report has been signed electronically. Number of Addenda: 0

## 2018-05-22 NOTE — Discharge Instructions (Signed)
Resume usual medications as before. High-fiber diet. No driving for 24 hours. Next colonoscopy in 5 years.      Colonoscopy, Adult, Care After This sheet gives you information about how to care for yourself after your procedure. Your doctor may also give you more specific instructions. If you have problems or questions, call your doctor. Follow these instructions at home: General instructions   For the first 24 hours after the procedure: ? Do not drive or use machinery. ? Do not sign important documents. ? Do not drink alcohol. ? Do your daily activities more slowly than normal. ? Eat foods that are soft and easy to digest. ? Rest often.  Take over-the-counter or prescription medicines only as told by your doctor.  It is up to you to get the results of your procedure. Ask your doctor, or the department performing the procedure, when your results will be ready. To help cramping and bloating:  Try walking around.  Put heat on your belly (abdomen) as told by your doctor. Use a heat source that your doctor recommends, such as a moist heat pack or a heating pad. ? Put a towel between your skin and the heat source. ? Leave the heat on for 20-30 minutes. ? Remove the heat if your skin turns bright red. This is especially important if you cannot feel pain, heat, or cold. You can get burned. Eating and drinking  Drink enough fluid to keep your pee (urine) clear or pale yellow.  Return to your normal diet as told by your doctor. Avoid heavy or fried foods that are hard to digest.  Avoid drinking alcohol for as long as told by your doctor. Contact a doctor if:  You have blood in your poop (stool) 2-3 days after the procedure. Get help right away if:  You have more than a small amount of blood in your poop.  You see large clumps of tissue (blood clots) in your poop.  Your belly is swollen.  You feel sick to your stomach (nauseous).  You throw up (vomit).  You have a  fever.  You have belly pain that gets worse, and medicine does not help your pain. This information is not intended to replace advice given to you by your health care provider. Make sure you discuss any questions you have with your health care provider. Document Released: 07/17/2010 Document Revised: 03/08/2016 Document Reviewed: 03/08/2016 Elsevier Interactive Patient Education  2017 Elsevier Inc.     Diverticulosis Diverticulosis is a condition that develops when small pouches (diverticula) form in the wall of the large intestine (colon). The colon is where water is absorbed and stool is formed. The pouches form when the inside layer of the colon pushes through weak spots in the outer layers of the colon. You may have a few pouches or many of them. What are the causes? The cause of this condition is not known. What increases the risk? The following factors may make you more likely to develop this condition:  Being older than age 28. Your risk for this condition increases with age. Diverticulosis is rare among people younger than age 45. By age 49, many people have it.  Eating a low-fiber diet.  Having frequent constipation.  Being overweight.  Not getting enough exercise.  Smoking.  Taking over-the-counter pain medicines, like aspirin and ibuprofen.  Having a family history of diverticulosis.  What are the signs or symptoms? In most people, there are no symptoms of this condition. If you do have  symptoms, they may include:  Bloating.  Cramps in the abdomen.  Constipation or diarrhea.  Pain in the lower left side of the abdomen.  How is this diagnosed? This condition is most often diagnosed during an exam for other colon problems. Because diverticulosis usually has no symptoms, it often cannot be diagnosed independently. This condition may be diagnosed by:  Using a flexible scope to examine the colon (colonoscopy).  Taking an X-ray of the colon after dye has been  put into the colon (barium enema).  Doing a CT scan.  How is this treated? You may not need treatment for this condition if you have never developed an infection related to diverticulosis. If you have had an infection before, treatment may include:  Eating a high-fiber diet. This may include eating more fruits, vegetables, and grains.  Taking a fiber supplement.  Taking a live bacteria supplement (probiotic).  Taking medicine to relax your colon.  Taking antibiotic medicines.  Follow these instructions at home:  Drink 6-8 glasses of water or more each day to prevent constipation.  Try not to strain when you have a bowel movement.  If you have had an infection before: ? Eat more fiber as directed by your health care provider or your diet and nutrition specialist (dietitian). ? Take a fiber supplement or probiotic, if your health care provider approves.  Take over-the-counter and prescription medicines only as told by your health care provider.  If you were prescribed an antibiotic, take it as told by your health care provider. Do not stop taking the antibiotic even if you start to feel better.  Keep all follow-up visits as told by your health care provider. This is important. Contact a health care provider if:  You have pain in your abdomen.  You have bloating.  You have cramps.  You have not had a bowel movement in 3 days. Get help right away if:  Your pain gets worse.  Your bloating becomes very bad.  You have a fever or chills, and your symptoms suddenly get worse.  You vomit.  You have bowel movements that are bloody or black.  You have bleeding from your rectum. Summary  Diverticulosis is a condition that develops when small pouches (diverticula) form in the wall of the large intestine (colon).  You may have a few pouches or many of them.  This condition is most often diagnosed during an exam for other colon problems.  If you have had an infection  related to diverticulosis, treatment may include increasing the fiber in your diet, taking supplements, or taking medicines. This information is not intended to replace advice given to you by your health care provider. Make sure you discuss any questions you have with your health care provider. Document Released: 03/11/2004 Document Revised: 05/03/2016 Document Reviewed: 05/03/2016 Elsevier Interactive Patient Education  2017 [employer].    High-Fiber Diet Fiber, also called dietary fiber, is a type of carbohydrate found in fruits, vegetables, whole grains, and beans. A high-fiber diet can have many health benefits. Your health care provider may recommend a high-fiber diet to help:  Prevent constipation. Fiber can make your bowel movements more regular.  Lower your cholesterol.  Relieve hemorrhoids, uncomplicated diverticulosis, or irritable bowel syndrome.  Prevent overeating as part of a weight-loss plan.  Prevent heart disease, type 2 diabetes, and certain cancers.  What is my plan? The recommended daily intake of fiber includes:  38 grams for men under age 87.  30 grams for men over  age 59.  1 grams for women under age 90.  48 grams for women over age 36.  You can get the recommended daily intake of dietary fiber by eating a variety of fruits, vegetables, grains, and beans. Your health care provider may also recommend a fiber supplement if it is not possible to get enough fiber through your diet. What do I need to know about a high-fiber diet?  Fiber supplements have not been widely studied for their effectiveness, so it is better to get fiber through food sources.  Always check the fiber content on thenutrition facts label of any prepackaged food. Look for foods that contain at least 5 grams of fiber per serving.  Ask your dietitian if you have questions about specific foods that are related to your condition, especially if those foods are not listed in the following  section.  Increase your daily fiber consumption gradually. Increasing your intake of dietary fiber too quickly may cause bloating, cramping, or gas.  Drink plenty of water. Water helps you to digest fiber. What foods can I eat? Grains Whole-grain breads. Multigrain cereal. Oats and oatmeal. Brown rice. Barley. Bulgur wheat. Basalt. Bran muffins. Popcorn. Rye wafer crackers. Vegetables Sweet potatoes. Spinach. Kale. Artichokes. Cabbage. Broccoli. Green peas. Carrots. Squash. Fruits Berries. Pears. Apples. Oranges. Avocados. Prunes and raisins. Dried figs. Meats and Other Protein Sources Navy, kidney, pinto, and soy beans. Split peas. Lentils. Nuts and seeds. Dairy Fiber-fortified yogurt. Beverages Fiber-fortified soy milk. Fiber-fortified orange juice. Other Fiber bars. The items listed above may not be a complete list of recommended foods or beverages. Contact your dietitian for more options. What foods are not recommended? Grains White bread. Pasta made with refined flour. White rice. Vegetables Fried potatoes. Canned vegetables. Well-cooked vegetables. Fruits Fruit juice. Cooked, strained fruit. Meats and Other Protein Sources Fatty cuts of meat. Fried Sales executive or fried fish. Dairy Milk. Yogurt. Cream cheese. Sour cream. Beverages Soft drinks. Other Cakes and pastries. Butter and oils. The items listed above may not be a complete list of foods and beverages to avoid. Contact your dietitian for more information. What are some tips for including high-fiber foods in my diet?  Eat a wide variety of high-fiber foods.  Make sure that half of all grains consumed each day are whole grains.  Replace breads and cereals made from refined flour or white flour with whole-grain breads and cereals.  Replace white rice with brown rice, bulgur wheat, or millet.  Start the day with a breakfast that is high in fiber, such as a cereal that contains at least 5 grams of fiber per  serving.  Use beans in place of meat in soups, salads, or pasta.  Eat high-fiber snacks, such as berries, raw vegetables, nuts, or popcorn. This information is not intended to replace advice given to you by your health care provider. Make sure you discuss any questions you have with your health care provider. Document Released: 06/14/2005 Document Revised: 11/20/2015 Document Reviewed: 11/27/2013 Elsevier Interactive Patient Education  [employer].

## 2018-05-22 NOTE — H&P (Addendum)
Eileen Arnold is an 57 y.o. female.   Chief Complaint: Patient is here for colonoscopy. HPI: She is a 57 year old Caucasian female who underwent higher screening colonoscopy in May 2016 with removal of 3 polyps.  2 of the polyps are sessile serrated polyps and one was tubular adenoma.  One sessile serrated polyp was 10 mm.  She is returning for surveillance colonoscopy.  She denies abdominal pain or change in bowel habits but she did have 3 distinct episodes of moderate amount of bright red blood per rectum few weeks ago. Family history significant for CRC and sister who had colon carcinoma at age 90.  It was stage IV.  She is in remission 13 years later.   Past medical history:      Hyperlipidemia.      History of colonic polyps.      Overactive bladder.      Obesity.       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       ?  Osteoarthritis.  Past Surgical History:  Procedure Laterality Date  . ABDOMINAL HYSTERECTOMY    . APPENDECTOMY    . BREAST REDUCTION SURGERY Bilateral   . BUNIONECTOMY Right   . CHOLECYSTECTOMY    . COLONOSCOPY N/A 12/13/2014   Procedure: COLONOSCOPY;  Surgeon: Rogene Houston, MD;  Location: [facility];  Service: Endoscopy;  Laterality: N/A;  830 - moved to 9:15 - Ann to notify pt  . OOPHORECTOMY Right 2011    Family History  Problem Relation Age of Onset  . Alzheimer's disease Mother   . Stroke Mother   . Heart disease Father   . Nephrolithiasis Father   . Cancer Sister   . Hypertension Sister    Social History:  reports that she quit smoking about 3 years ago. She has a 38.00 pack-year smoking history. She has never used smokeless tobacco. She reports that she drinks alcohol. She reports that she does not use drugs.  Allergies: No Known Allergies  Medications Prior to Admission  Medication Sig Dispense Refill  . acetaminophen (TYLENOL) 500 MG tablet Take 1,000 mg by mouth 3 (three) times daily as needed for moderate pain or headache.    . celecoxib  (CELEBREX) 200 MG capsule Take 1 capsule (200 mg total) by mouth daily. 90 capsule 0  . cetirizine (ZYRTEC) 10 MG tablet Take 1 tablet (10 mg total) by mouth daily. (Patient taking differently: Take 10 mg by mouth daily as needed for allergies. ) 30 tablet 11  . cholecalciferol (VITAMIN D3) 25 MCG (1000 UT) tablet Take 1,000 Units by mouth daily.    . mirabegron ER (MYRBETRIQ) 50 MG TB24 tablet Take 1 tablet (50 mg total) by mouth daily. 90 tablet 0  . Multiple Vitamin (MULTIVITAMIN WITH MINERALS) TABS tablet Take 1 tablet by mouth daily.    . Omega-3 Fatty Acids (FISH OIL) 1000 MG CAPS Take 1,000 mg by mouth daily.    . Probiotic CAPS Take 1 capsule by mouth daily.    . vitamin B-12 (CYANOCOBALAMIN) 500 MCG tablet Take 500 mcg by mouth daily.    Marland Kitchen bismuth subsalicylate (PEPTO BISMOL) 262 MG/15ML suspension Take 30 mLs by mouth as needed for indigestion.    . fluticasone (FLONASE) 50 MCG/ACT nasal spray Place 2 sprays into both nostrils daily. (Patient taking differently: Place 1 spray into both nostrils as needed for allergies. ) 16 g 6  . pravastatin (PRAVACHOL) 20 MG tablet Take 1 tablet (20 mg total) by mouth daily. (  Patient not taking: Reported on 05/16/2018) 90 tablet 3    No results found for this or any previous visit (from the past 48 hour(s)). No results found.  ROS  Blood pressure 131/88, pulse 82, temperature 98 F (36.7 C), temperature source Oral, resp. rate 16, SpO2 97 %. Physical Exam  Constitutional:  Well-developed obese Caucasian female in NAD.  HENT:  Mouth/Throat: Oropharynx is clear and moist.  Eyes: Conjunctivae are normal. No scleral icterus.  Neck: No thyromegaly present.  Cardiovascular: Normal rate, regular rhythm and normal heart sounds.  No murmur heard. Respiratory: Effort normal and breath sounds normal.  GI:  Abdomen is full but soft and nontender with organomegaly or masses.  Musculoskeletal: She exhibits no edema.  Lymphadenopathy:    She has no  cervical adenopathy.  Neurological: She is alert.  Skin: Skin is warm and dry.     Assessment/Plan History of tubular adenoma as well as sessile serrated polyps. Possible hemorrhoidal bleeding. History of CRC in first-degree relative at age 63. Surveillance colonoscopy.  Hildred Laser, MD 05/22/2018, 1:49 PM

## 2018-05-29 ENCOUNTER — Encounter (HOSPITAL_COMMUNITY): Payer: Self-pay | Admitting: Internal Medicine

## 2018-06-06 DIAGNOSIS — S338XXA Sprain of other parts of lumbar spine and pelvis, initial encounter: Secondary | ICD-10-CM | POA: Diagnosis not present

## 2018-06-06 DIAGNOSIS — S233XXA Sprain of ligaments of thoracic spine, initial encounter: Secondary | ICD-10-CM | POA: Diagnosis not present

## 2018-06-06 DIAGNOSIS — S134XXA Sprain of ligaments of cervical spine, initial encounter: Secondary | ICD-10-CM | POA: Diagnosis not present

## 2018-06-08 DIAGNOSIS — S233XXA Sprain of ligaments of thoracic spine, initial encounter: Secondary | ICD-10-CM | POA: Diagnosis not present

## 2018-06-08 DIAGNOSIS — S338XXA Sprain of other parts of lumbar spine and pelvis, initial encounter: Secondary | ICD-10-CM | POA: Diagnosis not present

## 2018-06-08 DIAGNOSIS — S134XXA Sprain of ligaments of cervical spine, initial encounter: Secondary | ICD-10-CM | POA: Diagnosis not present

## 2018-11-08 ENCOUNTER — Other Ambulatory Visit: Payer: Self-pay | Admitting: Family Medicine

## 2018-11-08 DIAGNOSIS — N3281 Overactive bladder: Secondary | ICD-10-CM

## 2018-11-09 ENCOUNTER — Telehealth: Payer: Self-pay | Admitting: Nurse Practitioner

## 2018-11-14 DIAGNOSIS — M549 Dorsalgia, unspecified: Secondary | ICD-10-CM | POA: Diagnosis not present

## 2018-12-07 ENCOUNTER — Other Ambulatory Visit: Payer: Self-pay | Admitting: Nurse Practitioner

## 2018-12-07 DIAGNOSIS — N3281 Overactive bladder: Secondary | ICD-10-CM

## 2018-12-07 NOTE — Telephone Encounter (Signed)
Patient aware and will call to set up an appt

## 2018-12-07 NOTE — Telephone Encounter (Signed)
MMM. NTBS 30 days given 10/1418

## 2019-01-01 ENCOUNTER — Other Ambulatory Visit: Payer: Self-pay

## 2019-01-01 ENCOUNTER — Ambulatory Visit: Payer: 59 | Admitting: Family Medicine

## 2019-01-01 ENCOUNTER — Encounter: Payer: Self-pay | Admitting: Family Medicine

## 2019-01-01 VITALS — BP 152/90 | HR 80 | Temp 97.5°F | Ht 63.0 in | Wt 273.8 lb

## 2019-01-01 DIAGNOSIS — S39012A Strain of muscle, fascia and tendon of lower back, initial encounter: Secondary | ICD-10-CM

## 2019-01-01 MED ORDER — CELECOXIB 200 MG PO CAPS: 200.0000 mg | ORAL_CAPSULE | Freq: Every day | ORAL | 0 refills | Status: DC

## 2019-01-01 MED ORDER — BACLOFEN 10 MG PO TABS: 10.0000 mg | ORAL_TABLET | Freq: Three times a day (TID) | ORAL | 0 refills | Status: DC

## 2019-01-01 NOTE — Progress Notes (Signed)
BP (!) 152/90   Pulse 80   Temp (!) 97.5 F (36.4 C) (Oral)   Ht 5\' 3"  (1.6 m)   Wt 273 lb 12.8 oz (124.2 kg)   BMI 48.50 kg/m    Subjective:   Patient ID: Eileen Arnold, female    DOB: [birth year], 58 y.o.   MRN: [id number]  HPI: Eileen Arnold is a 58 y.o. female presenting on 01/01/2019 for Back Pain (Patient states she was moving furniture in middle of May and has been having back pain since then.  Patient states she went to Urgent care and they gave her a shot and it did not help. Patient also states she went to the chiopractor 6 times and it never helped her back)   HPI Right lower back pain Patient has been having right lower back pain for about 2 months.  She says that she feels like she heard it when she was moving some furniture around at her workplace and since then has been hurting on the right lower back.  She says is worse in the morning and she wakes up very stiff and then it gets better with movement and ice.  She had also seen a chiropractor and that did help a little bit but very minimal.  She was given a shot at an urgent care as well and a muscle relaxer and an anti-inflammatory and feels like they helped a little but not completely.  She says is still bothering her a lot on that right lower back.  She says it is worse after sitting for long periods.  Relevant past medical, surgical, family and social history reviewed and updated as indicated. Interim medical history since our last visit reviewed. Allergies and medications reviewed and updated.  Review of Systems  Constitutional: Negative for chills and fever.  Eyes: Negative for visual disturbance.  Respiratory: Negative for chest tightness and shortness of breath.   Cardiovascular: Negative for chest pain and leg swelling.  Musculoskeletal: Positive for back pain and myalgias. Negative for gait problem and joint swelling.  Skin: Negative for color change and rash.  Neurological: Negative for light-headedness  and headaches.  Psychiatric/Behavioral: Negative for agitation and behavioral problems.  All other systems reviewed and are negative.   Per HPI unless specifically indicated above   Allergies as of 01/01/2019   No Known Allergies     Medication List       Accurate as of January 01, 2019  2:18 PM. If you have any questions, ask your nurse or doctor.        STOP taking these medications   cetirizine 10 MG tablet Commonly known as: ZYRTEC Stopped by: Worthy Rancher, MD     TAKE these medications   acetaminophen 500 MG tablet Commonly known as: TYLENOL Take 1,000 mg by mouth 3 (three) times daily as needed for moderate pain or headache.   bismuth subsalicylate 073 XT/06YI suspension Commonly known as: PEPTO BISMOL Take 30 mLs by mouth as needed for indigestion.   celecoxib 200 MG capsule Commonly known as: CELEBREX Take 1 capsule (200 mg total) by mouth daily.   cholecalciferol 25 MCG (1000 UT) tablet Commonly known as: VITAMIN D3 Take 1,000 Units by mouth daily.   Fish Oil 1000 MG Caps Take 1,000 mg by mouth daily.   fluticasone 50 MCG/ACT nasal spray Commonly known as: FLONASE Place 2 sprays into both nostrils daily. What changed:   how much to take  when to take this  reasons to take this   mirabegron ER 50 MG Tb24 tablet Commonly known as: Myrbetriq Take 1 tablet (50 mg total) by mouth daily. (Needs to be seen before next refill)   multivitamin with minerals Tabs tablet Take 1 tablet by mouth daily.   Probiotic Caps Take 1 capsule by mouth daily.   vitamin B-12 500 MCG tablet Commonly known as: CYANOCOBALAMIN Take 500 mcg by mouth daily.        Objective:   BP (!) 152/90   Pulse 80   Temp (!) 97.5 F (36.4 C) (Oral)   Ht 5\' 3"  (1.6 m)   Wt 273 lb 12.8 oz (124.2 kg)   BMI 48.50 kg/m   Wt Readings from Last 3 Encounters:  01/01/19 273 lb 12.8 oz (124.2 kg)  05/02/18 259 lb 3.2 oz (117.6 kg)  04/05/18 256 lb (116.1 kg)    Physical  Exam Vitals signs and nursing note reviewed.  Constitutional:      General: She is not in acute distress.    Appearance: She is well-developed. She is not diaphoretic.  Eyes:     Conjunctiva/sclera: Conjunctivae normal.  Musculoskeletal: Normal range of motion.     Lumbar back: She exhibits tenderness. She exhibits normal range of motion, no bony tenderness and no swelling.       Back:  Skin:    General: Skin is warm and dry.     Findings: No rash.  Neurological:     Mental Status: She is alert and oriented to person, place, and time.     Coordination: Coordination normal.  Psychiatric:        Behavior: Behavior normal.       Assessment & Plan:   Problem List Items Addressed This Visit    None    Visit Diagnoses    Strain of lumbar region, initial encounter    -  Primary   Relevant Medications   celecoxib (CELEBREX) 200 MG capsule   baclofen (LIORESAL) 10 MG tablet   Other Relevant Orders   Ambulatory referral to Physical Therapy      Will refill patient's Celebrex give her baclofen as muscle relaxer and refer to physical therapy Follow up plan: Return if symptoms worsen or fail to improve.  Counseling provided for all of the vaccine components No orders of the defined types were placed in this encounter.   Caryl Pina, MD [facility] 01/01/2019, 2:18 PM

## 2019-01-12 ENCOUNTER — Telehealth: Payer: Self-pay | Admitting: Nurse Practitioner

## 2019-01-12 MED ORDER — TIZANIDINE HCL 2 MG PO CAPS: 2.0000 mg | ORAL_CAPSULE | Freq: Three times a day (TID) | ORAL | 1 refills | Status: DC

## 2019-01-12 NOTE — Telephone Encounter (Signed)
Aware. 

## 2019-01-12 NOTE — Telephone Encounter (Signed)
I sent a different muscle relaxer for that she can try, they are all possibly sedating but see if this 1 does better for her.  She could also double up on her Celebrex for a week and take it twice a day and see if that calms things down.  If she continues to have problems that she may have to come in for imaging or an x-ray, make sure she calls before because we do not have x-ray every day until we hire a new x-ray tech. Caryl Pina, MD [facility] 01/12/2019, 12:58 PM

## 2019-01-12 NOTE — Telephone Encounter (Signed)
Please advise on another muscle relaxer for patient.

## 2019-01-30 ENCOUNTER — Encounter: Payer: Self-pay | Admitting: Family

## 2019-01-30 ENCOUNTER — Other Ambulatory Visit: Payer: Self-pay

## 2019-01-30 ENCOUNTER — Ambulatory Visit (INDEPENDENT_AMBULATORY_CARE_PROVIDER_SITE_OTHER): Payer: 59 | Admitting: Family

## 2019-01-30 DIAGNOSIS — J0100 Acute maxillary sinusitis, unspecified: Secondary | ICD-10-CM | POA: Diagnosis not present

## 2019-01-30 MED ORDER — AMOXICILLIN-POT CLAVULANATE 875-125 MG PO TABS: 1.0000 | ORAL_TABLET | Freq: Two times a day (BID) | ORAL | 0 refills | Status: DC

## 2019-01-30 NOTE — Progress Notes (Signed)
Virtual Visit via telephone Note Due to COVID-19 pandemic this visit was conducted virtually. This visit type was conducted due to national recommendations for restrictions regarding the COVID-19 Pandemic (e.g. social distancing, sheltering in place) in an effort to limit this patient's exposure and mitigate transmission in our community. All issues noted in this document were discussed and addressed.  A physical exam was not performed with this format.  I connected with Eileen Arnold on 01/30/19 at 3:11 pm by telephone and verified that I am speaking with the correct person using two identifiers. Eileen Arnold is currently located at driving  and no one  is currently with her during visit. The provider, Evelina Dun, FNP is located in their office at time of visit.  I discussed the limitations, risks, security and privacy concerns of performing an evaluation and management service by telephone and the availability of in person appointments. I also discussed with the patient that there may be a patient responsible charge related to this service. The patient expressed understanding and agreed to proceed.   History and Present Illness:  Headache  This is a new problem. The current episode started in the past 7 days. The problem occurs intermittently. The pain is located in the occipital region. The pain does not radiate. The pain quality is similar to prior headaches. The quality of the pain is described as aching. The pain is at a severity of 6/10. The pain is moderate. Associated symptoms include drainage, ear pain and sinus pressure. Pertinent negatives include no blurred vision, coughing, dizziness, eye pain, eye redness, eye watering, fever, loss of balance, neck pain, phonophobia, photophobia, sore throat or weakness. She has tried acetaminophen (flonase) for the symptoms. The treatment provided mild relief.      Review of Systems  Constitutional: Negative for fever.  HENT: Positive  for ear pain and sinus pressure. Negative for sore throat.   Eyes: Negative for blurred vision, photophobia, pain and redness.  Respiratory: Negative for cough.   Musculoskeletal: Negative for neck pain.  Neurological: Positive for headaches. Negative for dizziness, weakness and loss of balance.  All other systems reviewed and are negative.    Observations/Objective: No SOB or distress noted   Assessment and Plan: 1. Acute maxillary sinusitis, recurrence not specified Pt will continue to use Flonase and mucinex. She is leaving for the beach on Saturday. I have sent in Augmentin, but she will not take it unless her symptoms worsen or do not improve over the next 3-4 days - Take meds as prescribed - Use a cool mist humidifier  -Use saline nose sprays frequently -Force fluids -For any cough or congestion  Use plain Mucinex- regular strength or max strength is fine -For fever or aces or pains- take tylenol or ibuprofen. -Throat lozenges if help She does not wish to get COVID testing done at this time -Call office if symptoms worsen or do not improve  - amoxicillin-clavulanate (AUGMENTIN) 875-125 MG tablet; Take 1 tablet by mouth 2 (two) times daily.  Dispense: 14 tablet; Refill: 0     I discussed the assessment and treatment plan with the patient. The patient was provided an opportunity to ask questions and all were answered. The patient agreed with the plan and demonstrated an understanding of the instructions.   The patient was advised to call back or seek an in-person evaluation if the symptoms worsen or if the condition fails to improve as anticipated.  The above assessment and management plan was discussed with  the patient. The patient verbalized understanding of and has agreed to the management plan. Patient is aware to call the clinic if symptoms persist or worsen. Patient is aware when to return to the clinic for a follow-up visit. Patient educated on when it is appropriate to go  to the emergency department.   Time call ended: 3:20 pm    I provided 9 minutes of non-face-to-face time during this encounter.    Evelina Dun, FNP

## 2019-02-05 ENCOUNTER — Other Ambulatory Visit: Payer: Self-pay | Admitting: Family Medicine

## 2019-02-13 ENCOUNTER — Other Ambulatory Visit: Payer: Self-pay | Admitting: Nurse Practitioner

## 2019-02-14 NOTE — Telephone Encounter (Signed)
Pharmacy comment: REQUEST FOR 90 DAYS PRESCRIPTION.

## 2019-02-18 ENCOUNTER — Other Ambulatory Visit: Payer: Self-pay | Admitting: Nurse Practitioner

## 2019-02-18 DIAGNOSIS — N3281 Overactive bladder: Secondary | ICD-10-CM

## 2019-02-19 ENCOUNTER — Other Ambulatory Visit: Payer: Self-pay | Admitting: *Deleted

## 2019-02-19 DIAGNOSIS — N3281 Overactive bladder: Secondary | ICD-10-CM

## 2019-02-19 MED ORDER — MIRABEGRON ER 50 MG PO TB24: 50.0000 mg | ORAL_TABLET | Freq: Every day | ORAL | 0 refills | Status: DC

## 2019-02-19 NOTE — Telephone Encounter (Signed)
Spoke with pt

## 2019-02-19 NOTE — Telephone Encounter (Signed)
MMM. NTBS 30 days given 11/09/18. appts since then have been acute

## 2019-04-05 ENCOUNTER — Encounter: Payer: Self-pay | Admitting: Family Medicine

## 2019-04-05 ENCOUNTER — Ambulatory Visit (INDEPENDENT_AMBULATORY_CARE_PROVIDER_SITE_OTHER): Payer: 59 | Admitting: Family Medicine

## 2019-04-05 DIAGNOSIS — M48061 Spinal stenosis, lumbar region without neurogenic claudication: Secondary | ICD-10-CM | POA: Diagnosis not present

## 2019-04-05 MED ORDER — PREDNISONE 20 MG PO TABS: ORAL_TABLET | ORAL | 0 refills | Status: DC

## 2019-04-05 NOTE — Progress Notes (Signed)
Virtual Visit via telephone Note   I connected with Eileen Arnold on 04/05/19 at 1003 by telephone and verified that I am speaking with the correct person using two identifiers. Eileen Arnold is currently located at home and no other people are currently with her during visit. The provider, Fransisca Kaufmann Teriann Livingood, MD is located in their office at time of visit.  Call ended at 1018  I discussed the limitations, risks, security and privacy concerns of performing an evaluation and management service by telephone and the availability of in person appointments. I also discussed with the patient that there may be a patient responsible charge related to this service. The patient expressed understanding and agreed to proceed.   History and Present Illness: Patient is calling in for continue back issues every morning and she had a shot 5 months ago.  She has gone to pt for 3 weeks and uses celebrex and muscle relaxer.  The pain is right lower back. She says it has not improved at all and it has been every day that she has been having this.  She is also using salon paas which does help some.  No diagnosis found.  Outpatient Encounter Medications as of 04/05/2019  Medication Sig  . acetaminophen (TYLENOL) 500 MG tablet Take 1,000 mg by mouth 3 (three) times daily as needed for moderate pain or headache.  Marland Kitchen amoxicillin-clavulanate (AUGMENTIN) 875-125 MG tablet Take 1 tablet by mouth 2 (two) times daily.  . baclofen (LIORESAL) 10 MG tablet Take 1 tablet (10 mg total) by mouth 3 (three) times daily.  Marland Kitchen bismuth subsalicylate (PEPTO BISMOL) 262 MG/15ML suspension Take 30 mLs by mouth as needed for indigestion.  . celecoxib (CELEBREX) 200 MG capsule Take 1 capsule (200 mg total) by mouth daily.  . cholecalciferol (VITAMIN D3) 25 MCG (1000 UT) tablet Take 1,000 Units by mouth daily.  . fluticasone (FLONASE) 50 MCG/ACT nasal spray Place 2 sprays into both nostrils daily. (Patient taking differently: Place 1  spray into both nostrils as needed for allergies. )  . mirabegron ER (MYRBETRIQ) 50 MG TB24 tablet Take 1 tablet (50 mg total) by mouth daily.  . Multiple Vitamin (MULTIVITAMIN WITH MINERALS) TABS tablet Take 1 tablet by mouth daily.  . Omega-3 Fatty Acids (FISH OIL) 1000 MG CAPS Take 1,000 mg by mouth daily.  . Probiotic CAPS Take 1 capsule by mouth daily.  . tizanidine (ZANAFLEX) 2 MG capsule TAKE 1 CAPSULE (2 MG TOTAL) BY MOUTH 3 (THREE) TIMES DAILY.  . vitamin B-12 (CYANOCOBALAMIN) 500 MCG tablet Take 500 mcg by mouth daily.   No facility-administered encounter medications on file as of 04/05/2019.     Review of Systems  Constitutional: Negative for chills and fever.  Eyes: Negative for visual disturbance.  Respiratory: Negative for chest tightness and shortness of breath.   Cardiovascular: Negative for chest pain and leg swelling.  Musculoskeletal: Positive for arthralgias and back pain. Negative for gait problem.  Skin: Negative for rash.  Neurological: Negative for light-headedness and headaches.  Psychiatric/Behavioral: Negative for agitation and behavioral problems.  All other systems reviewed and are negative.   Observations/Objective: Patient sounds comfortable and in no acute distress  Assessment and Plan: Problem List Items Addressed This Visit    None    Visit Diagnoses    Spinal stenosis of lumbar region without neurogenic claudication    -  Primary   Relevant Medications   predniSONE (DELTASONE) 20 MG tablet   Other Relevant Orders  MR Lumbar Spine Wo Contrast       Follow Up Instructions: Follow as needed  Send prednisone and Mri because of continued back pain and history of spinal stenosis from an MRI of 6 years ago    I discussed the assessment and treatment plan with the patient. The patient was provided an opportunity to ask questions and all were answered. The patient agreed with the plan and demonstrated an understanding of the instructions.   The  patient was advised to call back or seek an in-person evaluation if the symptoms worsen or if the condition fails to improve as anticipated.  The above assessment and management plan was discussed with the patient. The patient verbalized understanding of and has agreed to the management plan. Patient is aware to call the clinic if symptoms persist or worsen. Patient is aware when to return to the clinic for a follow-up visit. Patient educated on when it is appropriate to go to the emergency department.    I provided 15 minutes of non-face-to-face time during this encounter.    Worthy Rancher, MD

## 2019-04-06 ENCOUNTER — Telehealth: Payer: Self-pay | Admitting: Nurse Practitioner

## 2019-04-10 ENCOUNTER — Other Ambulatory Visit: Payer: Self-pay

## 2019-04-10 DIAGNOSIS — Z20822 Contact with and (suspected) exposure to covid-19: Secondary | ICD-10-CM

## 2019-04-11 ENCOUNTER — Encounter: Payer: Self-pay | Admitting: Family Medicine

## 2019-04-11 DIAGNOSIS — S39012A Strain of muscle, fascia and tendon of lower back, initial encounter: Secondary | ICD-10-CM

## 2019-04-11 DIAGNOSIS — M48061 Spinal stenosis, lumbar region without neurogenic claudication: Secondary | ICD-10-CM

## 2019-04-11 MED ORDER — PREDNISONE 20 MG PO TABS: ORAL_TABLET | ORAL | 0 refills | Status: DC

## 2019-04-11 MED ORDER — CELECOXIB 200 MG PO CAPS: 200.0000 mg | ORAL_CAPSULE | Freq: Every day | ORAL | 1 refills | Status: DC

## 2019-04-12 ENCOUNTER — Ambulatory Visit (HOSPITAL_COMMUNITY): Payer: 59

## 2019-04-12 LAB — NOVEL CORONAVIRUS, NAA: SARS-CoV-2, NAA: NOT DETECTED

## 2019-04-26 ENCOUNTER — Other Ambulatory Visit: Payer: Self-pay

## 2019-04-26 ENCOUNTER — Ambulatory Visit (HOSPITAL_COMMUNITY)
Admission: RE | Admit: 2019-04-26 | Discharge: 2019-04-26 | Disposition: A | Discharge status: Home / Self Care | Payer: 59 | Source: Ambulatory Visit | Attending: Family Medicine | Admitting: Family Medicine

## 2019-04-26 DIAGNOSIS — M48061 Spinal stenosis, lumbar region without neurogenic claudication: Secondary | ICD-10-CM | POA: Diagnosis not present

## 2019-04-26 NOTE — Progress Notes (Signed)
referral placed per Dr. Keturah Barre.

## 2019-06-04 ENCOUNTER — Other Ambulatory Visit: Payer: Self-pay | Admitting: Nurse Practitioner

## 2019-06-04 DIAGNOSIS — N3281 Overactive bladder: Secondary | ICD-10-CM

## 2019-07-16 ENCOUNTER — Telehealth: Payer: Self-pay | Admitting: Nurse Practitioner

## 2019-07-16 ENCOUNTER — Ambulatory Visit: Payer: 59 | Admitting: Family Medicine

## 2019-07-16 ENCOUNTER — Encounter: Payer: Self-pay | Admitting: Family Medicine

## 2019-07-16 ENCOUNTER — Other Ambulatory Visit: Payer: Self-pay

## 2019-07-16 VITALS — BP 139/83 | HR 98 | Temp 99.0°F | Resp 20 | Ht 63.0 in | Wt 289.0 lb

## 2019-07-16 DIAGNOSIS — R350 Frequency of micturition: Secondary | ICD-10-CM

## 2019-07-16 DIAGNOSIS — R300 Dysuria: Secondary | ICD-10-CM

## 2019-07-16 LAB — URINALYSIS, COMPLETE
Bilirubin, UA: NEGATIVE
Glucose, UA: NEGATIVE
Leukocytes,UA: NEGATIVE
Nitrite, UA: NEGATIVE
RBC, UA: NEGATIVE
Specific Gravity, UA: >1.03 — ABNORMAL HIGH (ref 1.005–1.030)
Urobilinogen, Ur: 1 mg/dL (ref 0.2–1.0)
pH, UA: 5.5 (ref 5.0–7.5)

## 2019-07-16 LAB — MICROSCOPIC EXAMINATION

## 2019-07-16 NOTE — Progress Notes (Signed)
Subjective:  Patient ID: Eileen Arnold, female    DOB: 03-Jan-1961, 59 y.o.   MRN: [id number]  Patient Care Team: [name], FNP as PCP - General (Family Medicine)   Chief Complaint:  Urinary Tract Infection   HPI: Eileen Arnold is a 59 y.o. female presenting on 07/16/2019 for Urinary Tract Infection   Urinary Frequency  This is a new problem. The current episode started in the past 7 days. The problem occurs every urination. The problem has been unchanged. The quality of the pain is described as aching and burning. The pain is at a severity of 2/10. The pain is mild. There has been no fever. She is not sexually active. There is no history of pyelonephritis. Associated symptoms include frequency and urgency. Pertinent negatives include no chills, discharge, flank pain, hematuria, hesitancy, nausea, possible pregnancy, sweats or vomiting. She has tried nothing for the symptoms.     Relevant past medical, surgical, family, and social history reviewed and updated as indicated.  Allergies and medications reviewed and updated. Date reviewed: Chart in Epic.   Past Medical History:  Diagnosis Date  . Family hx of colon cancer     Past Surgical History:  Procedure Laterality Date  . ABDOMINAL HYSTERECTOMY    . APPENDECTOMY    . BREAST REDUCTION SURGERY Bilateral   . BUNIONECTOMY Right   . CHOLECYSTECTOMY    . COLONOSCOPY N/A 12/13/2014   Procedure: COLONOSCOPY;  Surgeon: Rogene Houston, MD;  [facility];  Service: Endoscopy;  Laterality: N/A;  830 - moved to 9:15 - Ann to notify pt  . COLONOSCOPY N/A 05/22/2018   Procedure: COLONOSCOPY;  Surgeon: Rogene Houston, MD;  Location: [facility];  Service: Endoscopy;  Laterality: N/A;  1:55  . OOPHORECTOMY Right 2011    Social History   Socioeconomic History  . Marital status: Married    Spouse name: Not on file  . Number of children: Not on file  . Years of education: Not on file  . Highest  education level: Not on file  Occupational History  . Not on file  Tobacco Use  . Smoking status: Former Smoker    Packs/day: 1.00    Years: 38.00    Pack years: 38.00    Quit date: 07/14/2014    Years since quitting: 5.0  . Smokeless tobacco: Never Used  Substance and Sexual Activity  . Alcohol use: Yes    Comment: occasionally  . Drug use: No  . Sexual activity: Yes  Other Topics Concern  . Not on file  Social History Narrative  . Not on file   Social Determinants of Health   Financial Resource Strain:   . Difficulty of Paying Living Expenses: Not on file  Food Insecurity:   . Worried About Charity fundraiser in the Last Year: Not on file  . Ran Out of Food in the Last Year: Not on file  Transportation Needs:   . Lack of Transportation (Medical): Not on file  . Lack of Transportation (Non-Medical): Not on file  Physical Activity:   . Days of Exercise per Week: Not on file  . Minutes of Exercise per Session: Not on file  Stress:   . Feeling of Stress : Not on file  Social Connections:   . Frequency of Communication with Friends and Family: Not on file  . Frequency of Social Gatherings with Friends and Family: Not on file  . Attends Religious Services: Not  on file  . Active Member of Clubs or Organizations: Not on file  . Attends Archivist Meetings: Not on file  . Marital Status: Not on file  Intimate Partner Violence:   . Fear of Current or Ex-Partner: Not on file  . Emotionally Abused: Not on file  . Physically Abused: Not on file  . Sexually Abused: Not on file    Outpatient Encounter Medications as of 07/16/2019  Medication Sig  . celecoxib (CELEBREX) 200 MG capsule Take 1 capsule (200 mg total) by mouth daily.  . cholecalciferol (VITAMIN D3) 25 MCG (1000 UT) tablet Take 1,000 Units by mouth daily.  . Multiple Vitamin (MULTIVITAMIN WITH MINERALS) TABS tablet Take 1 tablet by mouth daily.  Marland Kitchen MYRBETRIQ 50 MG TB24 tablet TAKE 1 TABLET BY MOUTH EVERY  DAY  . Omega-3 Fatty Acids (FISH OIL) 1000 MG CAPS Take 1,000 mg by mouth daily.  . Probiotic CAPS Take 1 capsule by mouth daily.  . vitamin B-12 (CYANOCOBALAMIN) 500 MCG tablet Take 500 mcg by mouth daily.  . vitamin B-6 (PYRIDOXINE) 25 MG tablet Take 25 mg by mouth daily.  Marland Kitchen acetaminophen (TYLENOL) 500 MG tablet Take 1,000 mg by mouth 3 (three) times daily as needed for moderate pain or headache.  . bismuth subsalicylate (PEPTO BISMOL) 262 MG/15ML suspension Take 30 mLs by mouth as needed for indigestion.  . fluticasone (FLONASE) 50 MCG/ACT nasal spray Place 2 sprays into both nostrils daily. (Patient not taking: Reported on 07/16/2019)  . tizanidine (ZANAFLEX) 2 MG capsule TAKE 1 CAPSULE (2 MG TOTAL) BY MOUTH 3 (THREE) TIMES DAILY. (Patient not taking: Reported on 07/16/2019)  . [DISCONTINUED] predniSONE (DELTASONE) 20 MG tablet 2 po at same time daily for 5 days   No facility-administered encounter medications on file as of 07/16/2019.    No Known Allergies  Review of Systems  Constitutional: Negative for activity change, appetite change, chills, diaphoresis, fatigue, fever and unexpected weight change.  HENT: Negative.   Eyes: Negative.   Respiratory: Negative for cough, chest tightness and shortness of breath.   Cardiovascular: Negative for chest pain, palpitations and leg swelling.  Gastrointestinal: Negative for abdominal pain, blood in stool, constipation, diarrhea, nausea and vomiting.  Endocrine: Negative.   Genitourinary: Positive for frequency and urgency. Negative for decreased urine volume, difficulty urinating, dyspareunia, dysuria, flank pain, hematuria, hesitancy, vaginal bleeding, vaginal discharge and vaginal pain.  Musculoskeletal: Negative for arthralgias, back pain and myalgias.  Skin: Negative.   Allergic/Immunologic: Negative.   Neurological: Negative for dizziness, weakness and headaches.  Hematological: Negative.   Psychiatric/Behavioral: Negative for confusion,  hallucinations, sleep disturbance and suicidal ideas.  All other systems reviewed and are negative.       Objective:  BP 139/83 (BP Location: Right Wrist, Cuff Size: Small)   Pulse 98   Temp 99 F (37.2 C)   Resp 20   Ht 5\' 3"  (1.6 m)   Wt 289 lb (131.1 kg)   SpO2 95%   BMI 51.19 kg/m    Wt Readings from Last 3 Encounters:  07/16/19 289 lb (131.1 kg)  01/01/19 273 lb 12.8 oz (124.2 kg)  05/02/18 259 lb 3.2 oz (117.6 kg)    Physical Exam Vitals and nursing note reviewed.  Constitutional:      General: She is not in acute distress.    Appearance: Normal appearance. She is well-developed and well-groomed. She is obese. She is not ill-appearing, toxic-appearing or diaphoretic.  HENT:     Head: Normocephalic and atraumatic.  Jaw: There is normal jaw occlusion.     Right Ear: Hearing normal.     Left Ear: Hearing normal.     Nose: Nose normal.     Mouth/Throat:     Lips: Pink.     Mouth: Mucous membranes are moist.     Pharynx: Oropharynx is clear. Uvula midline.  Eyes:     General: Lids are normal.     Extraocular Movements: Extraocular movements intact.     Conjunctiva/sclera: Conjunctivae normal.     Pupils: Pupils are equal, round, and reactive to light.  Neck:     Thyroid: No thyroid mass, thyromegaly or thyroid tenderness.     Vascular: No carotid bruit or JVD.     Trachea: Trachea and phonation normal.  Cardiovascular:     Rate and Rhythm: Normal rate and regular rhythm.     Chest Wall: PMI is not displaced.     Pulses: Normal pulses.     Heart sounds: Normal heart sounds. No murmur. No friction rub. No gallop.   Pulmonary:     Effort: Pulmonary effort is normal. No respiratory distress.     Breath sounds: Normal breath sounds. No wheezing.  Abdominal:     General: Bowel sounds are normal. There is no distension or abdominal bruit.     Palpations: Abdomen is soft. There is no hepatomegaly or splenomegaly.     Tenderness: There is no abdominal tenderness.  There is no right CVA tenderness or left CVA tenderness.     Hernia: No hernia is present.  Musculoskeletal:        General: Normal range of motion.     Cervical back: Normal range of motion and neck supple.     Right lower leg: No edema.     Left lower leg: No edema.  Lymphadenopathy:     Cervical: No cervical adenopathy.  Skin:    General: Skin is warm and dry.     Capillary Refill: Capillary refill takes less than 2 seconds.     Coloration: Skin is not cyanotic, jaundiced or pale.     Findings: No rash.  Neurological:     General: No focal deficit present.     Mental Status: She is alert and oriented to person, place, and time.     Cranial Nerves: Cranial nerves are intact.     Sensory: Sensation is intact.     Motor: Motor function is intact.     Coordination: Coordination is intact.     Gait: Gait is intact.     Deep Tendon Reflexes: Reflexes are normal and symmetric.  Psychiatric:        Attention and Perception: Attention and perception normal.        Mood and Affect: Mood and affect normal.        Speech: Speech normal.        Behavior: Behavior normal. Behavior is cooperative.        Thought Content: Thought content normal.        Cognition and Memory: Cognition and memory normal.        Judgment: Judgment normal.     Results for orders placed or performed in visit on 04/10/19  Novel Coronavirus, NAA ([facility])   Specimen: Nasopharyngeal(NP) swabs in vial transport medium   NASOPHARYNGE  TESTING  Result Value Ref Range   SARS-CoV-2, NAA Not Detected Not Detected     urinalysis unremarkable in office, will culture.   Pertinent labs & imaging results that were  available during my care of the patient were reviewed by me and considered in my medical decision making.  Assessment & Plan:  Annaleigh was seen today for urinary tract infection.  Diagnoses and all orders for this visit:  Dysuria Frequency of micturition Urinalysis unremarkable in office. Culture  pending. Will treat if warranted. Pt aware to increase water intake and avoid bladder irritants such as caffeine. If culture negative, will discuss changing Myrbetriq. Pt aware to report any new or worsening symptoms.  -     Urine Culture -     Urinalysis     Continue all other maintenance medications.  Follow up plan: Return if symptoms worsen or fail to improve.  Continue healthy lifestyle choices, including diet (rich in fruits, vegetables, and lean proteins, and low in salt and simple carbohydrates) and exercise (at least 30 minutes of moderate physical activity daily).  Educational handout given for urinary frequency  The above assessment and management plan was discussed with the patient. The patient verbalized understanding of and has agreed to the management plan. Patient is aware to call the clinic if they develop any new symptoms or if symptoms persist or worsen. Patient is aware when to return to the clinic for a follow-up visit. Patient educated on when it is appropriate to go to the emergency department.   Monia Pouch, FNP-C [facility] [phone]

## 2019-07-16 NOTE — Telephone Encounter (Signed)
Patient has appt

## 2019-07-16 NOTE — Patient Instructions (Signed)
Urinary Frequency, Adult Urinary frequency means urinating more often than usual. You may urinate every 1-2 hours even though you drink a normal amount of fluid and do not have a bladder infection or condition. Although you urinate more often than normal, the total amount of urine produced in a day is normal. With urinary frequency, you may have an urgent need to urinate often. The stress and anxiety of needing to find a bathroom quickly can make this urge worse. This condition may go away on its own or you may need treatment at home. Home treatment may include bladder training, exercises, taking medicines, or making changes to your diet. Follow these instructions at home: Bladder health   Keep a bladder diary if told by your health care provider. Keep track of: ? What you eat and drink. ? How often you urinate. ? How much you urinate.  Follow a bladder training program if told by your health care provider. This may include: ? Learning to delay going to the bathroom. ? Double urinating (voiding). This helps if you are not completely emptying your bladder. ? Scheduled voiding.  Do Kegel exercises as told by your health care provider. Kegel exercises strengthen the muscles that help control urination, which may help the condition. Eating and drinking  If told by your health care provider, make diet changes, such as: ? Avoiding caffeine. ? Drinking fewer fluids, especially alcohol. ? Not drinking in the evening. ? Avoiding foods or drinks that may irritate the bladder. These include coffee, tea, soda, artificial sweeteners, citrus, tomato-based foods, and chocolate. ? Eating foods that help prevent or ease constipation. Constipation can make this condition worse. Your health care provider may recommend that you:  Drink enough fluid to keep your urine pale yellow.  Take over-the-counter or prescription medicines.  Eat foods that are high in fiber, such as beans, whole grains, and fresh  fruits and vegetables.  Limit foods that are high in fat and processed sugars, such as fried or sweet foods. General instructions  Take over-the-counter and prescription medicines only as told by your health care provider.  Keep all follow-up visits as told by your health care provider. This is important. Contact a health care provider if:  You start urinating more often.  You feel pain or irritation when you urinate.  You notice blood in your urine.  Your urine looks cloudy.  You develop a fever.  You begin vomiting. Get help right away if:  You are unable to urinate. Summary  Urinary frequency means urinating more often than usual. With urinary frequency, you may urinate every 1-2 hours even though you drink a normal amount of fluid and do not have a bladder infection or other bladder condition.  Your health care provider may recommend that you keep a bladder diary, follow a bladder training program, or make dietary changes.  If told by your health care provider, do Kegel exercises to strengthen the muscles that help control urination.  Take over-the-counter and prescription medicines only as told by your health care provider.  Contact a health care provider if your symptoms do not improve or get worse. This information is not intended to replace advice given to you by your health care provider. Make sure you discuss any questions you have with your health care provider. Document Revised: 12/22/2017 Document Reviewed: 12/22/2017 Elsevier Patient Education  2020 [employer]  

## 2019-07-17 LAB — URINE CULTURE

## 2019-07-21 ENCOUNTER — Encounter: Payer: Self-pay | Admitting: Family Medicine

## 2019-07-23 ENCOUNTER — Other Ambulatory Visit: Payer: Self-pay | Admitting: Family Medicine

## 2019-07-23 DIAGNOSIS — N3281 Overactive bladder: Secondary | ICD-10-CM

## 2019-07-23 MED ORDER — SOLIFENACIN SUCCINATE 5 MG PO TABS: 5.0000 mg | ORAL_TABLET | Freq: Every day | ORAL | 5 refills | Status: AC

## 2019-08-17 ENCOUNTER — Other Ambulatory Visit: Payer: Self-pay | Admitting: Nurse Practitioner

## 2019-08-17 DIAGNOSIS — N3281 Overactive bladder: Secondary | ICD-10-CM

## 2019-08-22 ENCOUNTER — Encounter: Payer: Self-pay | Admitting: Family Medicine

## 2019-08-22 ENCOUNTER — Telehealth (INDEPENDENT_AMBULATORY_CARE_PROVIDER_SITE_OTHER): Payer: 59 | Admitting: Family Medicine

## 2019-08-22 DIAGNOSIS — N3281 Overactive bladder: Secondary | ICD-10-CM

## 2019-08-22 NOTE — Progress Notes (Signed)
Virtual Visit via MyChart Video Visit Note Due to COVID-19 pandemic this visit was conducted virtually. This visit type was conducted due to national recommendations for restrictions regarding the COVID-19 Pandemic (e.g. social distancing, sheltering in place) in an effort to limit this patient's exposure and mitigate transmission in our community. All issues noted in this document were discussed and addressed.  A physical exam was not performed with this format.   I connected with Eileen Arnold on 08/22/2019 at 1400 by MyChart Video and verified that I am speaking with the correct person using two identifiers. AMEERA BOULEY is currently located at home and family is currently with them during visit. The provider, Monia Pouch, FNP is located in their office at time of visit.  I discussed the limitations, risks, security and privacy concerns of performing an evaluation and management service by telephone and the availability of in person appointments. I also discussed with the patient that there may be a patient responsible charge related to this service. The patient expressed understanding and agreed to proceed.  Subjective:  Patient ID: Eileen Arnold, female    DOB: 01-06-1961, 59 y.o.   MRN: [id number]  Chief Complaint:  Urinary Frequency   HPI: Eileen Arnold is a 59 y.o. female presenting on 08/22/2019 for Urinary Frequency   Pt reports ongoing and worsening overactive bladder symptoms. States her nocturia has increased despite adding Vesicare to medication regimen. She denies UTI symptoms. Has been taking medications as prescribed and ceased caffeine use.   Urinary Frequency  Associated symptoms include frequency. Pertinent negatives include no chills, flank pain, hematuria, nausea, urgency or vomiting.     Relevant past medical, surgical, family, and social history reviewed and updated as indicated.  Allergies and medications reviewed and updated.   Past Medical  History:  Diagnosis Date  . Family hx of colon cancer     Past Surgical History:  Procedure Laterality Date  . ABDOMINAL HYSTERECTOMY    . APPENDECTOMY    . BREAST REDUCTION SURGERY Bilateral   . BUNIONECTOMY Right   . CHOLECYSTECTOMY    . COLONOSCOPY N/A 12/13/2014   Procedure: COLONOSCOPY;  Surgeon: Rogene Houston, MD;  [facility];  Service: Endoscopy;  Laterality: N/A;  830 - moved to 9:15 - Ann to notify pt  . COLONOSCOPY N/A 05/22/2018   Procedure: COLONOSCOPY;  Surgeon: Rogene Houston, MD;  [facility];  Service: Endoscopy;  Laterality: N/A;  1:55  . OOPHORECTOMY Right 2011    Social History   Socioeconomic History  . Marital status: Married    Spouse name: Not on file  . Number of children: Not on file  . Years of education: Not on file  . Highest education level: Not on file  Occupational History  . Not on file  Tobacco Use  . Smoking status: Former Smoker    Packs/day: 1.00    Years: 38.00    Pack years: 38.00    Quit date: 07/14/2014    Years since quitting: 5.1  . Smokeless tobacco: Never Used  Substance and Sexual Activity  . Alcohol use: Yes    Comment: occasionally  . Drug use: No  . Sexual activity: Yes  Other Topics Concern  . Not on file  Social History Narrative  . Not on file   Social Determinants of Health   Financial Resource Strain:   . Difficulty of Paying Living Expenses: Not on file  Food Insecurity:   . Worried  About Running Out of Food in the Last Year: Not on file  . Ran Out of Food in the Last Year: Not on file  Transportation Needs:   . Lack of Transportation (Medical): Not on file  . Lack of Transportation (Non-Medical): Not on file  Physical Activity:   . Days of Exercise per Week: Not on file  . Minutes of Exercise per Session: Not on file  Stress:   . Feeling of Stress : Not on file  Social Connections:   . Frequency of Communication with Friends and Family: Not on file  . Frequency of Social  Gatherings with Friends and Family: Not on file  . Attends Religious Services: Not on file  . Active Member of Clubs or Organizations: Not on file  . Attends Archivist Meetings: Not on file  . Marital Status: Not on file  Intimate Partner Violence:   . Fear of Current or Ex-Partner: Not on file  . Emotionally Abused: Not on file  . Physically Abused: Not on file  . Sexually Abused: Not on file    Outpatient Encounter Medications as of 08/22/2019  Medication Sig  . acetaminophen (TYLENOL) 500 MG tablet Take 1,000 mg by mouth 3 (three) times daily as needed for moderate pain or headache.  . bismuth subsalicylate (PEPTO BISMOL) 262 MG/15ML suspension Take 30 mLs by mouth as needed for indigestion.  . celecoxib (CELEBREX) 200 MG capsule Take 1 capsule (200 mg total) by mouth daily.  . cholecalciferol (VITAMIN D3) 25 MCG (1000 UT) tablet Take 1,000 Units by mouth daily.  . fluticasone (FLONASE) 50 MCG/ACT nasal spray Place 2 sprays into both nostrils daily. (Patient not taking: Reported on 07/16/2019)  . mirabegron ER (MYRBETRIQ) 50 MG TB24 tablet Take 1 tablet (50 mg total) by mouth daily. Needs to be seen for further refills.  . Multiple Vitamin (MULTIVITAMIN WITH MINERALS) TABS tablet Take 1 tablet by mouth daily.  . Omega-3 Fatty Acids (FISH OIL) 1000 MG CAPS Take 1,000 mg by mouth daily.  . Probiotic CAPS Take 1 capsule by mouth daily.  . solifenacin (VESICARE) 5 MG tablet Take 1 tablet (5 mg total) by mouth daily.  . tizanidine (ZANAFLEX) 2 MG capsule TAKE 1 CAPSULE (2 MG TOTAL) BY MOUTH 3 (THREE) TIMES DAILY. (Patient not taking: Reported on 07/16/2019)  . vitamin B-12 (CYANOCOBALAMIN) 500 MCG tablet Take 500 mcg by mouth daily.  . vitamin B-6 (PYRIDOXINE) 25 MG tablet Take 25 mg by mouth daily.   No facility-administered encounter medications on file as of 08/22/2019.    No Known Allergies  Review of Systems  Constitutional: Negative for activity change, appetite change,  chills, fatigue and fever.  HENT: Negative.   Eyes: Negative.   Respiratory: Negative for cough, chest tightness and shortness of breath.   Cardiovascular: Negative for chest pain, palpitations and leg swelling.  Gastrointestinal: Negative for abdominal pain, blood in stool, constipation, diarrhea, nausea and vomiting.  Endocrine: Negative.   Genitourinary: Positive for frequency. Negative for decreased urine volume, difficulty urinating, dysuria, flank pain, hematuria and urgency.  Musculoskeletal: Negative for arthralgias and myalgias.  Skin: Negative.   Allergic/Immunologic: Negative.   Neurological: Negative for dizziness and headaches.  Hematological: Negative.   Psychiatric/Behavioral: Negative for confusion, hallucinations, sleep disturbance and suicidal ideas.  All other systems reviewed and are negative.        Observations/Objective: No vital signs or physical exam, this was a telephone or virtual health encounter.  Pt alert and oriented, answers all  questions appropriately, and able to speak in full sentences.    Assessment and Plan: Janeisha was seen today for urinary frequency.  Diagnoses and all orders for this visit:  Overactive bladder Ongoing and worsening nocturia despite addition of Vesicare to Myrbetriq. Pt would like to see urology. No UTI symptoms present. Will place referral today. Pt declines changes in medication regimen.  -     Ambulatory referral to Urology     Follow Up Instructions: Return if symptoms worsen or fail to improve.    I discussed the assessment and treatment plan with the patient. The patient was provided an opportunity to ask questions and all were answered. The patient agreed with the plan and demonstrated an understanding of the instructions.   The patient was advised to call back or seek an in-person evaluation if the symptoms worsen or if the condition fails to improve as anticipated.  The above assessment and management plan  was discussed with the patient. The patient verbalized understanding of and has agreed to the management plan. Patient is aware to call the clinic if they develop any new symptoms or if symptoms persist or worsen. Patient is aware when to return to the clinic for a follow-up visit. Patient educated on when it is appropriate to go to the emergency department.    I provided 15 minutes of Video non-face-to-face time during this encounter. The video started at 1400. The video ended at 1415. The other time was used for coordination of care.    Monia Pouch, FNP-C [facility] [address] [place], [place] [address] [phone] 08/22/2019

## 2019-09-19 ENCOUNTER — Other Ambulatory Visit: Payer: Self-pay | Admitting: Nurse Practitioner

## 2019-09-19 DIAGNOSIS — N3281 Overactive bladder: Secondary | ICD-10-CM

## 2019-09-28 ENCOUNTER — Telehealth: Payer: 59 | Admitting: Emergency Medicine

## 2019-09-28 DIAGNOSIS — N3000 Acute cystitis without hematuria: Secondary | ICD-10-CM | POA: Diagnosis not present

## 2019-09-28 MED ORDER — CEPHALEXIN 500 MG PO CAPS: 500.0000 mg | ORAL_CAPSULE | Freq: Two times a day (BID) | ORAL | 0 refills | Status: AC

## 2019-09-28 NOTE — Progress Notes (Signed)

## 2019-10-16 ENCOUNTER — Other Ambulatory Visit: Payer: Self-pay

## 2019-10-16 ENCOUNTER — Encounter: Payer: Self-pay | Admitting: Urology

## 2019-10-16 ENCOUNTER — Ambulatory Visit: Payer: 59 | Admitting: Urology

## 2019-10-16 VITALS — BP 140/83 | HR 84 | Temp 98.9°F | Ht 63.0 in | Wt 245.0 lb

## 2019-10-16 DIAGNOSIS — N3281 Overactive bladder: Secondary | ICD-10-CM | POA: Diagnosis not present

## 2019-10-16 LAB — POCT URINALYSIS DIPSTICK
Bilirubin, UA: NEGATIVE
Blood, UA: NEGATIVE
Glucose, UA: NEGATIVE
Ketones, UA: NEGATIVE
Nitrite, UA: NEGATIVE
Protein, UA: POSITIVE — AB
Spec Grav, UA: 1.015 (ref 1.010–1.025)
Urobilinogen, UA: 0.2 E.U./dL
pH, UA: 6.5 (ref 5.0–8.0)

## 2019-10-16 LAB — BLADDER SCAN AMB NON-IMAGING: Scan Result: 13.2

## 2019-10-16 NOTE — Progress Notes (Signed)
H&P  Chief Complaint: Overactive Bladder + Nocturia  History of Present Illness: Eileen Arnold is a 59 y.o. year old female  4.20.2021: She presents today c/o OAB sx's with especially severe nighttime freq. She is already on two medications for OAB (myrbetriq and solifenacin), drinks no fluids after 7 pm, and drinks very little caffeine but still c/o 5-6x nocturia. She does have daytime freq/urgency but not as severe or bothersome as at night. She does snore but does not think she has sleep apnea/other sleep disorders (has not been tested). She has had issues with nocturia for several years but only thinks this has been bad for the last 6-8 months -- this coincides with worsened swelling on her ankles. She has not been evaluated for her edema and is not currently on any fluid medications.   Past Medical History:  Diagnosis Date  . Arthritis   . Family hx of colon cancer     Past Surgical History:  Procedure Laterality Date  . ABDOMINAL HYSTERECTOMY    . APPENDECTOMY    . BREAST REDUCTION SURGERY Bilateral   . BUNIONECTOMY Right   . CHOLECYSTECTOMY    . CHOLECYSTECTOMY    . COLONOSCOPY N/A 12/13/2014   Procedure: COLONOSCOPY;  Surgeon: Eileen Houston, MD;  Location: [facility];  Service: Endoscopy;  Laterality: N/A;  830 - moved to 9:15 - Ann to notify pt  . COLONOSCOPY N/A 05/22/2018   Procedure: COLONOSCOPY;  Surgeon: Eileen Houston, MD;  Location: [facility];  Service: Endoscopy;  Laterality: N/A;  1:55  . FOOT SURGERY    . OOPHORECTOMY Right 2011    Home Medications:  (Not in a hospital admission)   Allergies: No Known Allergies  Family History  Problem Relation Age of Onset  . Alzheimer's disease Mother   . Stroke Mother   . Heart disease Father   . Nephrolithiasis Father   . Cancer Sister   . Hypertension Sister     Social History:  reports that she quit smoking about 5 years ago. She has a 38.00 pack-year smoking history. She has never used smokeless  tobacco. She reports current alcohol use. She reports that she does not use drugs.  Urological Symptom Review Patient is experiencing the following symptoms: Frequent urination Get up at night to urinate Review of Systems Gastrointestinal (upper)  : Negative for upper GI symptoms Gastrointestinal (lower) : Negative for lower GI symptoms Constitutional : Fatigue Skin: Negative for skin symptoms Eyes: Negative for eye symptoms Ear/Nose/Throat : Negative for Ear/Nose/Throat symptoms Hematologic/Lymphatic: Easy bruising Cardiovascular : Leg swelling Respiratory : Negative for respiratory symptoms Endocrine: Negative for endocrine symptoms Musculoskeletal: Back pain  Neurological: Negative for neurological symptoms  Psychologic: Negative for psychiatric symptoms  Physical Exam:  Vital signs in last 24 hours: Temp:  [98.9 F (37.2 C)] 98.9 F (37.2 C) (04/20 1444) Pulse Rate:  [84] 84 (04/20 1444) BP: (140)/(83) 140/83 (04/20 1444) Weight:  [245 lb (111.1 kg)] 245 lb (111.1 kg) (04/20 1444) General:  Alert and oriented, No acute distress HEENT: Normocephalic, atraumatic Neck: No JVD or lymphadenopathy Cardiovascular: Regular rate  Lungs: Normal inspiratory/expiratory excursion Abdomen: Obese Back: No CVA tenderness Extremities: Significant LE pitting edema bilaterally Neurologic: Grossly intact  Laboratory Data:  Results for orders placed or performed in visit on 10/16/19 (from the past 24 hour(s))  POCT urinalysis dipstick     Status: Abnormal   Collection Time: 10/16/19  2:54 PM  Result Value Ref Range   Color, UA  yellow    Clarity, UA     Glucose, UA Negative Negative   Bilirubin, UA neg    Ketones, UA neg    Spec Grav, UA 1.015 1.010 - 1.025   Blood, UA neg    pH, UA 6.5 5.0 - 8.0   Protein, UA Positive (A) Negative   Urobilinogen, UA 0.2 0.2 or 1.0 E.U./dL   Nitrite, UA neg    Leukocytes, UA Small (1+) (A) Negative   Appearance clear    Odor       I have reviewed prior pt notes  I have reviewed notes from referring/previous physicians  I have reviewed urinalysis results  Impression/Assessment:  Already on 2 OAB medications with nighttime freq/urgency worse than daytime. Significant peripheral edema. Though this freq may be secondary in part to her OAB, it is also likely exacerbated by polyuria secondary to peripheral edema and possible OSA given her risk factors and snoring.   Plan:  1. Given advice for management of nocturia -- limit sodium, decrease caffeine and evening fluids, wear compression garments, and consult with PCP about starting on diuretic  2. I also recommended that she have a sleep apnea study.   3. Advised to also work on cardiovascular fitness and weight as additional ways of managing her sx's.   4. Return for OV in 3 mo's.   Eileen Arnold 10/16/2019, 3:04 PM  Eileen Arnold. Eileen Crudup MD

## 2019-12-03 ENCOUNTER — Other Ambulatory Visit: Payer: Self-pay | Admitting: Nurse Practitioner

## 2019-12-03 DIAGNOSIS — N3281 Overactive bladder: Secondary | ICD-10-CM

## 2019-12-03 MED ORDER — MIRABEGRON ER 50 MG PO TB24: 50.0000 mg | ORAL_TABLET | Freq: Every day | ORAL | 0 refills | Status: DC

## 2019-12-12 ENCOUNTER — Other Ambulatory Visit: Payer: Self-pay | Admitting: *Deleted

## 2019-12-12 DIAGNOSIS — S39012A Strain of muscle, fascia and tendon of lower back, initial encounter: Secondary | ICD-10-CM

## 2019-12-12 MED ORDER — CELECOXIB 200 MG PO CAPS: 200.0000 mg | ORAL_CAPSULE | Freq: Every day | ORAL | 1 refills | Status: DC

## 2020-01-14 ENCOUNTER — Other Ambulatory Visit: Payer: Self-pay | Admitting: *Deleted

## 2020-01-14 MED ORDER — SOLIFENACIN SUCCINATE 5 MG PO TABS: 5.0000 mg | ORAL_TABLET | Freq: Every day | ORAL | 0 refills | Status: DC

## 2020-01-15 ENCOUNTER — Ambulatory Visit: Payer: 59 | Admitting: Urology

## 2020-01-22 ENCOUNTER — Ambulatory Visit: Payer: 59 | Admitting: Urology

## 2020-02-26 ENCOUNTER — Ambulatory Visit: Payer: 59 | Admitting: Urology

## 2020-03-07 ENCOUNTER — Other Ambulatory Visit: Payer: Self-pay | Admitting: Nurse Practitioner

## 2020-03-07 DIAGNOSIS — N3281 Overactive bladder: Secondary | ICD-10-CM

## 2020-03-10 ENCOUNTER — Ambulatory Visit (INDEPENDENT_AMBULATORY_CARE_PROVIDER_SITE_OTHER): Payer: 59 | Admitting: Nurse Practitioner

## 2020-03-10 ENCOUNTER — Other Ambulatory Visit: Payer: Self-pay

## 2020-03-10 ENCOUNTER — Ambulatory Visit (HOSPITAL_COMMUNITY)
Admission: RE | Admit: 2020-03-10 | Discharge: 2020-03-10 | Disposition: A | Discharge status: Home / Self Care | Payer: 59 | Source: Ambulatory Visit | Attending: Nurse Practitioner | Admitting: Nurse Practitioner

## 2020-03-10 DIAGNOSIS — R1032 Left lower quadrant pain: Secondary | ICD-10-CM | POA: Insufficient documentation

## 2020-03-10 MED ORDER — CIPROFLOXACIN HCL 500 MG PO TABS: 500.0000 mg | ORAL_TABLET | Freq: Two times a day (BID) | ORAL | 0 refills | Status: DC

## 2020-03-10 MED ORDER — METRONIDAZOLE 500 MG PO TABS: 500.0000 mg | ORAL_TABLET | Freq: Two times a day (BID) | ORAL | 0 refills | Status: DC

## 2020-03-10 NOTE — Addendum Note (Signed)
Addended by: [name] on: 03/10/2020 01:59 PM   Modules accepted: Orders

## 2020-03-10 NOTE — Progress Notes (Signed)
° °  Virtual Visit via telephone Note Due to COVID-19 pandemic this visit was conducted virtually. This visit type was conducted due to national recommendations for restrictions regarding the COVID-19 Pandemic (e.g. social distancing, sheltering in place) in an effort to limit this patient's exposure and mitigate transmission in our community. All issues noted in this document were discussed and addressed.  A physical exam was not performed with this format.  I connected with Eileen Arnold on 03/10/20 at 10:40 by telephone and verified that I am speaking with the correct person using two identifiers. Eileen Arnold is currently located at home and no one is currently with her during visit. The provider, Mary-Margaret Hassell Done, FNP is located in their office at time of visit.  I discussed the limitations, risks, security and privacy concerns of performing an evaluation and management service by telephone and the availability of in person appointments. I also discussed with the patient that there may be a patient responsible charge related to this service. The patient expressed understanding and agreed to proceed.   History and Present Illness:   Chief Complaint: Abdominal Pain   HPI Patient calls in c/o lower abdominal pain. She went to urgent care on Sunday and was dx with dysuria with urine clear. He gave her keflex and pyridium. She is no better today. Pain is in lower abdomem in midline. Rates pain 8/10 currently. The urgent care recommended a ct scan.   Review of Systems  Constitutional: Negative for chills and fever.  Respiratory: Negative.   Gastrointestinal: Positive for abdominal pain, diarrhea and nausea. Negative for constipation and vomiting.  Genitourinary: Negative for dysuria, frequency and urgency.  Neurological: Negative.   Psychiatric/Behavioral: Negative.      Observations/Objective: Alert and oriented- answers all questions appropriately No distress   Assessment  and Plan: Eileen Arnold in today with chief complaint of Abdominal Pain   1. LLQ pain Npo Will call with test results - CT Abdomen Pelvis Wo Contrast    Follow Up Instructions: prn    I discussed the assessment and treatment plan with the patient. The patient was provided an opportunity to ask questions and all were answered. The patient agreed with the plan and demonstrated an understanding of the instructions.   The patient was advised to call back or seek an in-person evaluation if the symptoms worsen or if the condition fails to improve as anticipated.  The above assessment and management plan was discussed with the patient. The patient verbalized understanding of and has agreed to the management plan. Patient is aware to call the clinic if symptoms persist or worsen. Patient is aware when to return to the clinic for a follow-up visit. Patient educated on when it is appropriate to go to the emergency department.   Time call ended:  10:55  I provided 15 minutes of non-face-to-face time during this encounter.    Mary-Margaret Hassell Done, FNP

## 2020-03-11 ENCOUNTER — Ambulatory Visit: Payer: 59 | Admitting: Family Medicine

## 2020-03-13 ENCOUNTER — Encounter: Payer: Self-pay | Admitting: Nurse Practitioner

## 2020-03-14 MED ORDER — ONDANSETRON 4 MG PO TBDP: 4.0000 mg | ORAL_TABLET | Freq: Three times a day (TID) | ORAL | 0 refills | Status: DC | PRN

## 2020-03-31 ENCOUNTER — Other Ambulatory Visit: Payer: Self-pay | Admitting: Nurse Practitioner

## 2020-03-31 DIAGNOSIS — N3281 Overactive bladder: Secondary | ICD-10-CM

## 2020-04-28 ENCOUNTER — Other Ambulatory Visit: Payer: Self-pay | Admitting: Nurse Practitioner

## 2020-04-28 DIAGNOSIS — N3281 Overactive bladder: Secondary | ICD-10-CM

## 2020-04-28 NOTE — Telephone Encounter (Signed)
MMM NTBS 30 days given 03/31/20

## 2020-06-01 ENCOUNTER — Other Ambulatory Visit: Payer: Self-pay | Admitting: Nurse Practitioner

## 2020-06-01 DIAGNOSIS — S39012A Strain of muscle, fascia and tendon of lower back, initial encounter: Secondary | ICD-10-CM

## 2020-07-04 ENCOUNTER — Other Ambulatory Visit: Payer: Self-pay | Admitting: Nurse Practitioner

## 2020-07-04 DIAGNOSIS — S39012A Strain of muscle, fascia and tendon of lower back, initial encounter: Secondary | ICD-10-CM

## 2020-08-05 ENCOUNTER — Encounter: Payer: Self-pay | Admitting: Nurse Practitioner

## 2020-08-05 ENCOUNTER — Other Ambulatory Visit: Payer: Self-pay

## 2020-08-05 ENCOUNTER — Ambulatory Visit: Payer: 59 | Admitting: Nurse Practitioner

## 2020-08-05 VITALS — BP 143/80 | HR 72 | Temp 98.2°F | Resp 20 | Ht 63.0 in | Wt 246.0 lb

## 2020-08-05 DIAGNOSIS — N3281 Overactive bladder: Secondary | ICD-10-CM | POA: Diagnosis not present

## 2020-08-05 DIAGNOSIS — Z6841 Body Mass Index (BMI) 40.0 and over, adult: Secondary | ICD-10-CM | POA: Diagnosis not present

## 2020-08-05 DIAGNOSIS — M159 Polyosteoarthritis, unspecified: Secondary | ICD-10-CM

## 2020-08-05 MED ORDER — CELECOXIB 200 MG PO CAPS: ORAL_CAPSULE | ORAL | 1 refills | Status: DC

## 2020-08-05 MED ORDER — SOLIFENACIN SUCCINATE 5 MG PO TABS: 5.0000 mg | ORAL_TABLET | Freq: Every day | ORAL | 1 refills | Status: DC

## 2020-08-05 NOTE — Progress Notes (Signed)
Subjective:    Patient ID: Eileen Arnold, female    DOB: 10-19-1960, 60 y.o.   MRN: [id number]   Chief Complaint: Medical Management of Chronic Issues    HPI:  1. Overactive bladder On Myrbetriq, but it is too expensive. She would like to switch to something else, but overactive bladder is better on myrbetriq.   2. Morbid obesity with BMI of 45.0-49.9, adult ([place]) Tries to follow a low fat diet, exercising regularly. Has lost 40+lbs in the 8 mo on a low carb diet. It is easy for her to follow because her entire family is on the same diet. Feels like she has a good support system.   Wt Readings from Last 3 Encounters:  08/05/20 246 lb (111.6 kg)  10/16/19 245 lb (111.1 kg)  07/16/19 289 lb (131.1 kg)    3. Osteoarthritis of multiple joints, unspecified osteoarthritis type On celebrex. Working well for her. "Cant live without it." No new exacerbations or new areas with pain. Would like a refill today.     Outpatient Encounter Medications as of 08/05/2020  Medication Sig  . acetaminophen (TYLENOL) 500 MG tablet Take 1,000 mg by mouth 3 (three) times daily as needed for moderate pain or headache.  . bismuth subsalicylate (PEPTO BISMOL) 262 MG/15ML suspension Take 30 mLs by mouth as needed for indigestion.  . celecoxib (CELEBREX) 200 MG capsule TAKE 1 CAPSULE BY MOUTH EVERY DAY  . cholecalciferol (VITAMIN D3) 25 MCG (1000 UT) tablet Take 1,000 Units by mouth daily.  . fluticasone (FLONASE) 50 MCG/ACT nasal spray Place 2 sprays into both nostrils daily.  . Multiple Vitamin (MULTIVITAMIN WITH MINERALS) TABS tablet Take 1 tablet by mouth daily.  Marland Kitchen MYRBETRIQ 50 MG TB24 tablet TAKE 1 TABLET (50 MG TOTAL) BY MOUTH DAILY. (NEEDS TO BE SEEN BEFORE NEXT REFILL)  . Omega-3 Fatty Acids (FISH OIL) 1000 MG CAPS Take 1,000 mg by mouth daily.  . Probiotic CAPS Take 1 capsule by mouth daily.  . tizanidine (ZANAFLEX) 2 MG capsule TAKE 1 CAPSULE (2 MG TOTAL) BY MOUTH 3 (THREE) TIMES DAILY.  .  vitamin B-12 (CYANOCOBALAMIN) 500 MCG tablet Take 500 mcg by mouth daily.  . vitamin B-6 (PYRIDOXINE) 25 MG tablet Take 25 mg by mouth daily.  . [DISCONTINUED] ciprofloxacin (CIPRO) 500 MG tablet Take 1 tablet (500 mg total) by mouth 2 (two) times daily.  . [DISCONTINUED] metroNIDAZOLE (FLAGYL) 500 MG tablet Take 1 tablet (500 mg total) by mouth 2 (two) times daily.  . [DISCONTINUED] ondansetron (ZOFRAN ODT) 4 MG disintegrating tablet Take 1 tablet (4 mg total) by mouth every 8 (eight) hours as needed for nausea or vomiting.  . [DISCONTINUED] solifenacin (VESICARE) 5 MG tablet Take 1 tablet (5 mg total) by mouth daily.   No facility-administered encounter medications on file as of 08/05/2020.    Past Surgical History:  Procedure Laterality Date  . ABDOMINAL HYSTERECTOMY    . APPENDECTOMY    . BREAST REDUCTION SURGERY Bilateral   . BUNIONECTOMY Right   . CHOLECYSTECTOMY    . CHOLECYSTECTOMY    . COLONOSCOPY N/A 12/13/2014   Procedure: COLONOSCOPY;  Surgeon: Rogene Houston, MD;  [facility];  Service: Endoscopy;  Laterality: N/A;  830 - moved to 9:15 - Ann to notify pt  . COLONOSCOPY N/A 05/22/2018   Procedure: COLONOSCOPY;  Surgeon: Rogene Houston, MD;  [facility];  Service: Endoscopy;  Laterality: N/A;  1:55  . FOOT SURGERY    .  OOPHORECTOMY Right 2011    Family History  Problem Relation Age of Onset  . Alzheimer's disease Mother   . Stroke Mother   . Heart disease Father   . Nephrolithiasis Father   . Cancer Sister   . Hypertension Sister     New complaints:  New cyst on left shoulder. Dry skin.  Social history:  Lives with husband.   Controlled substance contract: N/A     Review of Systems  Constitutional: Negative for activity change, appetite change, fatigue and fever.  Respiratory: Negative for cough, shortness of breath and wheezing.   Cardiovascular: Negative for chest pain, palpitations and leg swelling.  Gastrointestinal: Negative  for constipation, diarrhea, nausea and vomiting.  Genitourinary: Negative for difficulty urinating, dysuria and frequency.  Musculoskeletal: Positive for arthralgias. Negative for joint swelling.  Neurological: Negative for dizziness, light-headedness, numbness and headaches.       Objective:   Physical Exam Constitutional:      Appearance: She is normal weight.  HENT:     Head: Normocephalic.  Eyes:     Extraocular Movements: Extraocular movements intact.     Pupils: Pupils are equal, round, and reactive to light.  Neck:     Vascular: No carotid bruit.  Cardiovascular:     Rate and Rhythm: Normal rate and regular rhythm.     Pulses: Normal pulses.     Heart sounds: Normal heart sounds.  Pulmonary:     Effort: Pulmonary effort is normal.     Breath sounds: Normal breath sounds.  Abdominal:     General: Bowel sounds are normal.     Palpations: Abdomen is soft.  Musculoskeletal:        General: Normal range of motion.     Cervical back: Normal range of motion and neck supple.  Skin:    General: Skin is warm and dry.     Capillary Refill: Capillary refill takes less than 2 seconds.     Findings: Abscess present.       Neurological:     Mental Status: She is alert and oriented to person, place, and time.  Psychiatric:        Mood and Affect: Mood normal.        Behavior: Behavior normal.        Thought Content: Thought content normal.        Judgment: Judgment normal.    Vitals:   08/05/20 1201  BP: (!) 143/80  Pulse: 72  Resp: 20  Temp: 98.2 F (36.8 C)  SpO2: 95%       Assessment & Plan:   Eileen Arnold comes in today with chief complaint of Medical Management of Chronic Issues   Diagnosis and orders addressed:  1. Overactive bladder Will discontinue Myrbetriq and begin vesicare. She will alert the office if her overactive bladder symptoms return on new medication.  - solifenacin (VESICARE) 5 MG tablet; Take 1 tablet (5 mg total) by mouth daily.   Dispense: 90 tablet; Refill: 1  2. Morbid obesity with BMI of 45.0-49.9, adult ([facility]) Continue low carb diet. Has lost 40lbs. Attempt to incorporate exercise daily for at least 30 minutes a day, especially as it gets warmer through the year.    3. Osteoarthritis of multiple joints, unspecified osteoarthritis type  Continue Celebrex. Attempt to increase activity to 30 minutes per day.  - celecoxib (CELEBREX) 200 MG capsule; TAKE 1 CAPSULE BY MOUTH EVERY DAY  Dispense: 90 capsule; Refill: 1   Labs pending Health Maintenance  reviewed Diet and exercise encouraged  Follow up plan: PRN  Dollene Primrose, RN, BSN, FNP-Student  Mary-Margaret Hassell Done, FNP

## 2020-08-05 NOTE — Patient Instructions (Signed)
Exercising to Stay Healthy To become healthy and stay healthy, it is recommended that you do moderate-intensity and vigorous-intensity exercise. You can tell that you are exercising at a moderate intensity if your heart starts beating faster and you start breathing faster but can still hold a conversation. You can tell that you are exercising at a vigorous intensity if you are breathing much harder and faster and cannot hold a conversation while exercising. Exercising regularly is important. It has many health benefits, such as:  Improving overall fitness, flexibility, and endurance.  Increasing bone density.  Helping with weight control.  Decreasing body fat.  Increasing muscle strength.  Reducing stress and tension.  Improving overall health. How often should I exercise? Choose an activity that you enjoy, and set realistic goals. Your health care provider can help you make an activity plan that works for you. Exercise regularly as told by your health care provider. This may include:  Doing strength training two times a week, such as: ? Lifting weights. ? Using resistance bands. ? Push-ups. ? Sit-ups. ? Yoga.  Doing a certain intensity of exercise for a given amount of time. Choose from these options: ? A total of 150 minutes of moderate-intensity exercise every week. ? A total of 75 minutes of vigorous-intensity exercise every week. ? A mix of moderate-intensity and vigorous-intensity exercise every week. Children, pregnant women, people who have not exercised regularly, people who are overweight, and older adults may need to talk with a health care provider about what activities are safe to do. If you have a medical condition, be sure to talk with your health care provider before you start a new exercise program. What are some exercise ideas? Moderate-intensity exercise ideas include:  Walking 1 mile (1.6 km) in about 15  minutes.  Biking.  Hiking.  Golfing.  Dancing.  Water aerobics. Vigorous-intensity exercise ideas include:  Walking 4.5 miles (7.2 km) or more in about 1 hour.  Jogging or running 5 miles (8 km) in about 1 hour.  Biking 10 miles (16.1 km) or more in about 1 hour.  Lap swimming.  Roller-skating or in-line skating.  Cross-country skiing.  Vigorous competitive sports, such as football, basketball, and soccer.  Jumping rope.  Aerobic dancing.   What are some everyday activities that can help me to get exercise?  Yard work, such as: ? Pushing a lawn mower. ? Raking and bagging leaves.  Washing your car.  Pushing a stroller.  Shoveling snow.  Gardening.  Washing windows or floors. How can I be more active in my day-to-day activities?  Use stairs instead of an elevator.  Take a walk during your lunch break.  If you drive, park your car farther away from your work or school.  If you take public transportation, get off one stop early and walk the rest of the way.  Stand up or walk around during all of your indoor phone calls.  Get up, stretch, and walk around every 30 minutes throughout the day.  Enjoy exercise with a friend. Support to continue exercising will help you keep a regular routine of activity. What guidelines can I follow while exercising?  Before you start a new exercise program, talk with your health care provider.  Do not exercise so much that you hurt yourself, feel dizzy, or get very short of breath.  Wear comfortable clothes and wear shoes with good support.  Drink plenty of water while you exercise to prevent dehydration or heat stroke.  Work out until   your breathing and your heartbeat get faster. Where to find more information  [place] Department of Health and Human Services: www.hhs.gov  Centers for Disease Control and Prevention (CDC): www.cdc.gov Summary  Exercising regularly is important. It will improve your overall fitness,  flexibility, and endurance.  Regular exercise also will improve your overall health. It can help you control your weight, reduce stress, and improve your bone density.  Do not exercise so much that you hurt yourself, feel dizzy, or get very short of breath.  Before you start a new exercise program, talk with your health care provider. This information is not intended to replace advice given to you by your health care provider. Make sure you discuss any questions you have with your health care provider. Document Revised: 05/27/2017 Document Reviewed: 05/05/2017 Elsevier Patient Education  2021 Elsevier Inc.  

## 2020-08-06 LAB — CBC WITH DIFFERENTIAL/PLATELET
Basophils Absolute: 0 10*3/uL (ref 0.0–0.2)
Basos: 0 %
EOS (ABSOLUTE): 0 10*3/uL (ref 0.0–0.4)
Eos: 1 %
Hematocrit: 40.9 % (ref 34.0–46.6)
Hemoglobin: 14 g/dL (ref 11.1–15.9)
Immature Grans (Abs): 0 10*3/uL (ref 0.0–0.1)
Immature Granulocytes: 0 %
Lymphocytes Absolute: 2.2 10*3/uL (ref 0.7–3.1)
Lymphs: 37 %
MCH: 31.7 pg (ref 26.6–33.0)
MCHC: 34.2 g/dL (ref 31.5–35.7)
MCV: 93 fL (ref 79–97)
Monocytes Absolute: 0.4 10*3/uL (ref 0.1–0.9)
Monocytes: 7 %
Neutrophils Absolute: 3.2 10*3/uL (ref 1.4–7.0)
Neutrophils: 55 %
Platelets: 244 10*3/uL (ref 150–450)
RBC: 4.42 x10E6/uL (ref 3.77–5.28)
RDW: 12.1 % (ref 11.7–15.4)
WBC: 5.9 10*3/uL (ref 3.4–10.8)

## 2020-08-06 LAB — LIPID PANEL
Chol/HDL Ratio: 3.9 ratio (ref 0.0–4.4)
Cholesterol, Total: 208 mg/dL — ABNORMAL HIGH (ref 100–199)
HDL: 53 mg/dL (ref >39)
LDL Chol Calc (NIH): 139 mg/dL — ABNORMAL HIGH (ref 0–99)
Triglycerides: 92 mg/dL (ref 0–149)
VLDL Cholesterol Cal: 16 mg/dL (ref 5–40)

## 2020-08-06 LAB — CMP14+EGFR
ALT: 19 IU/L (ref 0–32)
AST: 20 IU/L (ref 0–40)
Albumin/Globulin Ratio: 1.8 (ref 1.2–2.2)
Albumin: 4.2 g/dL (ref 3.8–4.9)
Alkaline Phosphatase: 83 IU/L (ref 44–121)
BUN/Creatinine Ratio: 20 (ref 9–23)
BUN: 14 mg/dL (ref 6–24)
Bilirubin Total: 0.4 mg/dL (ref 0.0–1.2)
CO2: 25 mmol/L (ref 20–29)
Calcium: 9.4 mg/dL (ref 8.7–10.2)
Chloride: 105 mmol/L (ref 96–106)
Creatinine, Ser: 0.71 mg/dL (ref 0.57–1.00)
GFR calc Af Amer: 109 mL/min/{1.73_m2} (ref >59)
GFR calc non Af Amer: 94 mL/min/{1.73_m2} (ref >59)
Globulin, Total: 2.3 g/dL (ref 1.5–4.5)
Glucose: 98 mg/dL (ref 65–99)
Potassium: 4.7 mmol/L (ref 3.5–5.2)
Sodium: 144 mmol/L (ref 134–144)
Total Protein: 6.5 g/dL (ref 6.0–8.5)

## 2020-08-18 ENCOUNTER — Ambulatory Visit: Payer: 59 | Admitting: Family Medicine

## 2020-08-18 ENCOUNTER — Encounter: Payer: Self-pay | Admitting: Family Medicine

## 2020-08-18 DIAGNOSIS — J0100 Acute maxillary sinusitis, unspecified: Secondary | ICD-10-CM | POA: Diagnosis not present

## 2020-08-18 MED ORDER — AMOXICILLIN-POT CLAVULANATE 875-125 MG PO TABS: 1.0000 | ORAL_TABLET | Freq: Two times a day (BID) | ORAL | 0 refills | Status: DC

## 2020-08-18 NOTE — Progress Notes (Signed)
Virtual Visit via telephone Note  I connected with Eileen Arnold on 08/18/20 at 1313 by telephone and verified that I am speaking with the correct person using two identifiers. Eileen Arnold is currently located at home and patient are currently with her during visit. The provider, Fransisca Kaufmann Gilberto Stanforth, MD is located in their office at time of visit.  Call ended at 1318  I discussed the limitations, risks, security and privacy concerns of performing an evaluation and management service by telephone and the availability of in person appointments. I also discussed with the patient that there may be a patient responsible charge related to this service. The patient expressed understanding and agreed to proceed.   History and Present Illness: Patient has 4 days of sinus pressure and nasal drainage and congestion.  She took a home covid test and it was negative and using mucinex.  It helped some.  She denies any known sick contacts. She denies any fevers and chills or SOB or loss of taste or smell.  She does use flonase everyday.    No diagnosis found.  Outpatient Encounter Medications as of 08/18/2020  Medication Sig  . acetaminophen (TYLENOL) 500 MG tablet Take 1,000 mg by mouth 3 (three) times daily as needed for moderate pain or headache.  . bismuth subsalicylate (PEPTO BISMOL) 262 MG/15ML suspension Take 30 mLs by mouth as needed for indigestion.  . celecoxib (CELEBREX) 200 MG capsule TAKE 1 CAPSULE BY MOUTH EVERY DAY  . cholecalciferol (VITAMIN D3) 25 MCG (1000 UT) tablet Take 1,000 Units by mouth daily.  . fluticasone (FLONASE) 50 MCG/ACT nasal spray Place 2 sprays into both nostrils daily.  . Multiple Vitamin (MULTIVITAMIN WITH MINERALS) TABS tablet Take 1 tablet by mouth daily.  . Omega-3 Fatty Acids (FISH OIL) 1000 MG CAPS Take 1,000 mg by mouth daily.  . Probiotic CAPS Take 1 capsule by mouth daily.  . solifenacin (VESICARE) 5 MG tablet Take 1 tablet (5 mg total) by mouth daily.   . tizanidine (ZANAFLEX) 2 MG capsule TAKE 1 CAPSULE (2 MG TOTAL) BY MOUTH 3 (THREE) TIMES DAILY.  . vitamin B-12 (CYANOCOBALAMIN) 500 MCG tablet Take 500 mcg by mouth daily.  . vitamin B-6 (PYRIDOXINE) 25 MG tablet Take 25 mg by mouth daily.   No facility-administered encounter medications on file as of 08/18/2020.    Review of Systems  Constitutional: Negative for chills and fever.  HENT: Positive for congestion, postnasal drip, rhinorrhea, sinus pressure, sneezing and sore throat. Negative for ear discharge and ear pain.   Eyes: Negative for pain, redness and visual disturbance.  Respiratory: Positive for cough. Negative for chest tightness and shortness of breath.   Cardiovascular: Negative for chest pain and leg swelling.  Genitourinary: Negative for difficulty urinating and dysuria.  Musculoskeletal: Negative for back pain and gait problem.  Skin: Negative for rash.  Neurological: Negative for light-headedness and headaches.  Psychiatric/Behavioral: Negative for agitation and behavioral problems.  All other systems reviewed and are negative.   Observations/Objective: Patient sounds comfortable and in no acute distress  Assessment and Plan: Problem List Items Addressed This Visit   None   Visit Diagnoses    Acute maxillary sinusitis, recurrence not specified    -  Primary   Relevant Medications   amoxicillin-clavulanate (AUGMENTIN) 875-125 MG tablet       Follow up plan: Return if symptoms worsen or fail to improve.     I discussed the assessment and treatment plan with the patient. The patient  was provided an opportunity to ask questions and all were answered. The patient agreed with the plan and demonstrated an understanding of the instructions.   The patient was advised to call back or seek an in-person evaluation if the symptoms worsen or if the condition fails to improve as anticipated.  The above assessment and management plan was discussed with the patient. The  patient verbalized understanding of and has agreed to the management plan. Patient is aware to call the clinic if symptoms persist or worsen. Patient is aware when to return to the clinic for a follow-up visit. Patient educated on when it is appropriate to go to the emergency department.    I provided 5 minutes of non-face-to-face time during this encounter.    Worthy Rancher, MD

## 2020-11-27 ENCOUNTER — Other Ambulatory Visit: Payer: Self-pay | Admitting: Nurse Practitioner

## 2020-11-27 DIAGNOSIS — Z1231 Encounter for screening mammogram for malignant neoplasm of breast: Secondary | ICD-10-CM

## 2020-12-04 ENCOUNTER — Other Ambulatory Visit: Payer: Self-pay

## 2020-12-04 ENCOUNTER — Ambulatory Visit
Admission: RE | Admit: 2020-12-04 | Discharge: 2020-12-04 | Disposition: A | Discharge status: Home / Self Care | Payer: 59 | Source: Ambulatory Visit | Attending: Nurse Practitioner | Admitting: Nurse Practitioner

## 2020-12-04 DIAGNOSIS — Z1231 Encounter for screening mammogram for malignant neoplasm of breast: Secondary | ICD-10-CM

## 2020-12-20 ENCOUNTER — Other Ambulatory Visit: Payer: Self-pay | Admitting: Nurse Practitioner

## 2020-12-20 DIAGNOSIS — N3281 Overactive bladder: Secondary | ICD-10-CM

## 2020-12-24 NOTE — Addendum Note (Signed)
Encounter addended by: Earlene Plater on: 12/24/2020 7:38 AM  Actions taken: Imaging Exam begun

## 2020-12-24 NOTE — Addendum Note (Signed)
Encounter addended by: Earlene Plater on: 12/24/2020 7:39 AM  Actions taken: Imaging Exam ended

## 2021-02-09 ENCOUNTER — Telehealth: Payer: Self-pay | Admitting: Nurse Practitioner

## 2021-02-09 ENCOUNTER — Other Ambulatory Visit: Payer: Self-pay

## 2021-02-09 DIAGNOSIS — N3281 Overactive bladder: Secondary | ICD-10-CM

## 2021-02-09 DIAGNOSIS — M159 Polyosteoarthritis, unspecified: Secondary | ICD-10-CM

## 2021-02-09 MED ORDER — CELECOXIB 200 MG PO CAPS: ORAL_CAPSULE | ORAL | 1 refills | Status: DC

## 2021-02-09 MED ORDER — SOLIFENACIN SUCCINATE 5 MG PO TABS: 5.0000 mg | ORAL_TABLET | Freq: Every day | ORAL | 1 refills | Status: DC

## 2021-02-09 NOTE — Telephone Encounter (Signed)
Sent to [facility] per patients request. Patient notified

## 2021-02-23 ENCOUNTER — Telehealth (INDEPENDENT_AMBULATORY_CARE_PROVIDER_SITE_OTHER): Payer: 59 | Admitting: Nurse Practitioner

## 2021-02-23 ENCOUNTER — Encounter: Payer: Self-pay | Admitting: Nurse Practitioner

## 2021-02-23 DIAGNOSIS — J00 Acute nasopharyngitis [common cold]: Secondary | ICD-10-CM | POA: Diagnosis not present

## 2021-02-23 NOTE — Progress Notes (Signed)
Virtual Visit via video Note   Due to COVID-19 pandemic this visit was conducted virtually. This visit type was conducted due to national recommendations for restrictions regarding the COVID-19 Pandemic (e.g. social distancing, sheltering in place) in an effort to limit this patient's exposure and mitigate transmission in our community. All issues noted in this document were discussed and addressed.  A physical exam was not performed with this format.  I connected with  Eileen Arnold  on 02/23/21 at 11:15 by video and verified that I am speaking with the correct person using two identifiers. Eileen Arnold is currently located at home and no ne is currently with her during visit. The provider, Mary-Margaret Hassell Done, FNP is located in their office at time of visit.  I discussed the limitations, risks, security and privacy concerns of performing an evaluation and management service by video  and the availability of in person appointments. I also discussed with the patient that there may be a patient responsible charge related to this service. The patient expressed understanding and agreed to proceed.   History and Present Illness:    Chief Complaint: uri  HPIr Patient says that she developed sore throat  yesterday. Has developed into congestion and cough. Her nose feels like it is burning. Slight headache. Slight myalgia and , malaise.   Review of Systems  Constitutional:  Positive for chills and fever.  HENT:  Positive for congestion and sore throat.   Respiratory:  Positive for cough and sputum production. Negative for shortness of breath.   Musculoskeletal:  Positive for myalgias.  Neurological:  Positive for headaches.  All other systems reviewed and are negative.     Observations/Objective: Alert and oriented- answers all questions appropriately No distress Voice hoarse Slight dry cough noted  Assessment and Plan: Eileen Arnold in today with chief complaint of  URI   1. Acute nasopharyngitis  1. Take meds as prescribed 2. Use a cool mist humidifier especially during the winter months and when heat has been humid. 3. Use saline nose sprays frequently 4. Saline irrigations of the nose can be very helpful if done frequently.  * 4X daily for 1 week*  * Use of a nettie pot can be helpful with this. Follow directions with this* 5. Drink plenty of fluids 6. Keep thermostat turn down low 7.For any cough or congestion  Use plain Mucinex- regular strength or max strength is fine   * Children- consult with Pharmacist for dosing 8. For fever or aces or pains- take tylenol or ibuprofen appropriate for age and weight.  * for fevers greater than 101 orally you may alternate ibuprofen and tylenol every  3 hours.   Retest for covid tomorrow morning- call if negative  Follow Up Instructions: prn    I discussed the assessment and treatment plan with the patient. The patient was provided an opportunity to ask questions and all were answered. The patient agreed with the plan and demonstrated an understanding of the instructions.   The patient was advised to call back or seek an in-person evaluation if the symptoms worsen or if the condition fails to improve as anticipated.  The above assessment and management plan was discussed with the patient. The patient verbalized understanding of and has agreed to the management plan. Patient is aware to call the clinic if symptoms persist or worsen. Patient is aware when to return to the clinic for a follow-up visit. Patient educated on when it is appropriate to go to  the emergency department.   Time call ended: 11:27  I provided 12 minutes of face-to-face time during this encounter.    Mary-Margaret Hassell Done, FNP

## 2021-02-24 ENCOUNTER — Telehealth: Payer: Self-pay | Admitting: Nurse Practitioner

## 2021-02-24 ENCOUNTER — Other Ambulatory Visit: Payer: Self-pay | Admitting: Nurse Practitioner

## 2021-02-24 MED ORDER — MOLNUPIRAVIR EUA 200MG CAPSULE: 4.0000 | ORAL_CAPSULE | Freq: Two times a day (BID) | ORAL | 0 refills | Status: AC

## 2021-02-24 NOTE — Telephone Encounter (Signed)
Molnupivir sent o pharmacy

## 2021-02-24 NOTE — Telephone Encounter (Signed)
Pt called to let MMM know that she tested positive for covid with at home test after her video visit on 8/29. Would like medicine sent to [facility] in [place] (phone number)

## 2021-02-24 NOTE — Telephone Encounter (Signed)
Changed.

## 2021-02-24 NOTE — Telephone Encounter (Signed)
Patient notified and verbalized understanding. 

## 2021-02-24 NOTE — Progress Notes (Signed)
lnu

## 2021-05-30 ENCOUNTER — Other Ambulatory Visit: Payer: Self-pay | Admitting: Nurse Practitioner

## 2021-05-30 DIAGNOSIS — M159 Polyosteoarthritis, unspecified: Secondary | ICD-10-CM

## 2021-06-29 ENCOUNTER — Other Ambulatory Visit: Payer: Self-pay | Admitting: Nurse Practitioner

## 2021-06-29 DIAGNOSIS — M159 Polyosteoarthritis, unspecified: Secondary | ICD-10-CM

## 2021-07-20 ENCOUNTER — Other Ambulatory Visit: Payer: Self-pay | Admitting: Nurse Practitioner

## 2021-07-20 DIAGNOSIS — N3281 Overactive bladder: Secondary | ICD-10-CM

## 2021-08-12 ENCOUNTER — Other Ambulatory Visit: Payer: Self-pay | Admitting: Nurse Practitioner

## 2021-08-12 DIAGNOSIS — N3281 Overactive bladder: Secondary | ICD-10-CM

## 2021-09-12 ENCOUNTER — Other Ambulatory Visit: Payer: Self-pay | Admitting: Nurse Practitioner

## 2021-09-12 DIAGNOSIS — M159 Polyosteoarthritis, unspecified: Secondary | ICD-10-CM

## 2021-09-12 DIAGNOSIS — N3281 Overactive bladder: Secondary | ICD-10-CM

## 2021-09-22 ENCOUNTER — Other Ambulatory Visit: Payer: Self-pay | Admitting: Nurse Practitioner

## 2021-09-22 DIAGNOSIS — N3281 Overactive bladder: Secondary | ICD-10-CM

## 2021-09-22 DIAGNOSIS — M159 Polyosteoarthritis, unspecified: Secondary | ICD-10-CM

## 2021-09-28 ENCOUNTER — Encounter: Payer: Self-pay | Admitting: Nurse Practitioner

## 2021-09-28 ENCOUNTER — Ambulatory Visit: Payer: 59 | Admitting: Nurse Practitioner

## 2021-09-28 VITALS — BP 137/79 | HR 69 | Temp 98.3°F | Resp 18 | Ht 63.0 in | Wt 246.0 lb

## 2021-09-28 DIAGNOSIS — Z6841 Body Mass Index (BMI) 40.0 and over, adult: Secondary | ICD-10-CM

## 2021-09-28 DIAGNOSIS — N3281 Overactive bladder: Secondary | ICD-10-CM

## 2021-09-28 DIAGNOSIS — M159 Polyosteoarthritis, unspecified: Secondary | ICD-10-CM

## 2021-09-28 MED ORDER — SOLIFENACIN SUCCINATE 5 MG PO TABS: 5.0000 mg | ORAL_TABLET | Freq: Every day | ORAL | 0 refills | Status: DC

## 2021-09-28 MED ORDER — CELECOXIB 200 MG PO CAPS: 200.0000 mg | ORAL_CAPSULE | Freq: Two times a day (BID) | ORAL | 1 refills | Status: DC

## 2021-09-28 NOTE — Patient Instructions (Signed)

## 2021-09-28 NOTE — Progress Notes (Signed)
? ?Subjective:  ? ? Patient ID: Eileen Arnold, female    DOB: 19-Sep-1960, 61 y.o.   MRN: [id number] ? ?Chief Complaint: medical management of chronic issues  ?  ? ?HPI: ? ?Eileen Arnold is a 61 y.o. who identifies as a female who was assigned female at birth.  ? ?Social history: ?Lives with: husband ?Work history: hairdresser ? ? ?Comes in today for follow up of the following chronic medical issues: ? ?1. Overactive bladder ?Vesicare daily and is doing well. ? ?2. Osteoarthritis of multiple joints, unspecified osteoarthritis type ?Is celebrex which helps her to stand linger and work ? ?3. Morbid obesity with BMI of 45.0-49.9, adult ([place]) ?No recent eight changes ?Wt Readings from Last 3 Encounters:  ?09/28/21 246 lb (111.6 kg)  ?08/05/20 246 lb (111.6 kg)  ?10/16/19 245 lb (111.1 kg)  ? ?BMI Readings from Last 3 Encounters:  ?09/28/21 43.58 kg/m?  ?08/05/20 43.58 kg/m?  ?10/16/19 43.40 kg/m?  ? ? ? ? ?New complaints: ?None today ? ?No Known Allergies ?Outpatient Encounter Medications as of 09/28/2021  ?Medication Sig  ? acetaminophen (TYLENOL) 500 MG tablet Take 1,000 mg by mouth 3 (three) times daily as needed for moderate pain or headache.  ? amoxicillin-clavulanate (AUGMENTIN) 875-125 MG tablet Take 1 tablet by mouth 2 (two) times daily.  ? bismuth subsalicylate (PEPTO BISMOL) 262 MG/15ML suspension Take 30 mLs by mouth as needed for indigestion.  ? celecoxib (CELEBREX) 200 MG capsule TAKE 1 CAPSULE BY MOUTH EVERY DAY  ? cholecalciferol (VITAMIN D3) 25 MCG (1000 UT) tablet Take 1,000 Units by mouth daily.  ? fluticasone (FLONASE) 50 MCG/ACT nasal spray Place 2 sprays into both nostrils daily.  ? Multiple Vitamin (MULTIVITAMIN WITH MINERALS) TABS tablet Take 1 tablet by mouth daily.  ? Omega-3 Fatty Acids (FISH OIL) 1000 MG CAPS Take 1,000 mg by mouth daily.  ? Probiotic CAPS Take 1 capsule by mouth daily.  ? solifenacin (VESICARE) 5 MG tablet Take 1 tablet (5 mg total) by mouth daily. (NEEDS TO BE SEEN BEFORE  NEXT REFILL)  ? tizanidine (ZANAFLEX) 2 MG capsule TAKE 1 CAPSULE (2 MG TOTAL) BY MOUTH 3 (THREE) TIMES DAILY.  ? vitamin B-12 (CYANOCOBALAMIN) 500 MCG tablet Take 500 mcg by mouth daily.  ? vitamin B-6 (PYRIDOXINE) 25 MG tablet Take 25 mg by mouth daily.  ? ?No facility-administered encounter medications on file as of 09/28/2021.  ? ? ?Past Surgical History:  ?Procedure Laterality Date  ? ABDOMINAL HYSTERECTOMY    ? APPENDECTOMY    ? BREAST REDUCTION SURGERY Bilateral   ? BUNIONECTOMY Right   ? CHOLECYSTECTOMY    ? CHOLECYSTECTOMY    ? COLONOSCOPY N/A 12/13/2014  ? Procedure: COLONOSCOPY;  Surgeon: Rogene Houston, MD;  [facility];  Service: Endoscopy;  Laterality: N/A;  830 - moved to 9:15 - Ann to notify pt  ? COLONOSCOPY N/A 05/22/2018  ? Procedure: COLONOSCOPY;  Surgeon: Rogene Houston, MD;  Location: [facility];  Service: Endoscopy;  Laterality: N/A;  1:55  ? FOOT SURGERY    ? OOPHORECTOMY Right 2011  ? ? ?Family History  ?Problem Relation Age of Onset  ? Alzheimer's disease Mother   ? Stroke Mother   ? Heart disease Father   ? Nephrolithiasis Father   ? Cancer Sister   ? Hypertension Sister   ? ? ? ? ?Controlled substance contract: n/a ? ? ? ? ? ?Review of Systems  ?Constitutional:  Negative for diaphoresis.  ?Eyes:  Negative for pain.  ?Respiratory:  Negative for shortness of breath.   ?Cardiovascular:  Negative for chest pain, palpitations and leg swelling.  ?Gastrointestinal:  Negative for abdominal pain.  ?Endocrine: Negative for polydipsia.  ?Skin:  Negative for rash.  ?Neurological:  Negative for dizziness, weakness and headaches.  ?Hematological:  Does not bruise/bleed easily.  ?All other systems reviewed and are negative. ? ?   ?Objective:  ? Physical Exam ?Vitals and nursing note reviewed.  ?Constitutional:   ?   General: She is not in acute distress. ?   Appearance: Normal appearance. She is well-developed.  ?HENT:  ?   Head: Normocephalic.  ?   Right Ear: Tympanic membrane normal.  ?    Left Ear: Tympanic membrane normal.  ?   Nose: Nose normal.  ?   Mouth/Throat:  ?   Mouth: Mucous membranes are moist.  ?Eyes:  ?   Pupils: Pupils are equal, round, and reactive to light.  ?Neck:  ?   Vascular: No carotid bruit or JVD.  ?Cardiovascular:  ?   Rate and Rhythm: Normal rate and regular rhythm.  ?   Heart sounds: Normal heart sounds.  ?Pulmonary:  ?   Effort: Pulmonary effort is normal. No respiratory distress.  ?   Breath sounds: Normal breath sounds. No wheezing or rales.  ?Chest:  ?   Chest wall: No tenderness.  ?Abdominal:  ?   General: Bowel sounds are normal. There is no distension or abdominal bruit.  ?   Palpations: Abdomen is soft. There is no hepatomegaly, splenomegaly, mass or pulsatile mass.  ?   Tenderness: There is no abdominal tenderness.  ?Musculoskeletal:     ?   General: Normal range of motion.  ?   Cervical back: Normal range of motion and neck supple.  ?Lymphadenopathy:  ?   Cervical: No cervical adenopathy.  ?Skin: ?   General: Skin is warm and dry.  ?Neurological:  ?   Mental Status: She is alert and oriented to person, place, and time.  ?   Deep Tendon Reflexes: Reflexes are normal and symmetric.  ?Psychiatric:     ?   Behavior: Behavior normal.     ?   Thought Content: Thought content normal.     ?   Judgment: Judgment normal.  ? ? ?BP 137/79   Pulse 69   Temp 98.3 ?F (36.8 ?C)   Resp 18   Ht _0  (1.6 m)   Wt 246 lb (111.6 kg)   SpO2 95%   BMI 43.58 kg/m?  ? ? ? ?   ?Assessment & Plan:  ?[name] in today with chief complaint of Medication Management ? ? ?1. Overactive bladder ?Force fluids ?- solifenacin (VESICARE) 5 MG tablet; Take 1 tablet (5 mg total) by mouth daily. (NEEDS TO BE SEEN BEFORE NEXT REFILL)  Dispense: 30 tablet; Refill: 0 ? ?2. Osteoarthritis of multiple joints, unspecified osteoarthritis type ? ?- celecoxib (CELEBREX) 200 MG capsule; Take 1 capsule (200 mg total) by mouth 2 (two) times daily.  Dispense: 180 capsule; Refill: 1 ? ?3. Morbid  obesity with BMI of 45.0-49.9, adult ([place]) ?Discussed diet and exercise for person with BMI >25 ?Will recheck weight in 3-6 months ? ?- CBC with Differential/Platelet ?- CMP14+EGFR ?- Lipid panel ? ? ? ?The above assessment and management plan was discussed with the patient. The patient verbalized understanding of and has agreed to the management plan. Patient is aware to call the clinic if symptoms persist  or worsen. Patient is aware when to return to the clinic for a follow-up visit. Patient educated on when it is appropriate to go to the emergency department.  ? ?Mary-Margaret Hassell Done, FNP ? ? ? ?

## 2021-09-29 LAB — CMP14+EGFR
ALT: 21 IU/L (ref 0–32)
AST: 22 IU/L (ref 0–40)
Albumin/Globulin Ratio: 2.1 (ref 1.2–2.2)
Albumin: 4.2 g/dL (ref 3.8–4.9)
Alkaline Phosphatase: 89 IU/L (ref 44–121)
BUN/Creatinine Ratio: 24 — ABNORMAL HIGH (ref 9–23)
BUN: 17 mg/dL (ref 6–24)
Bilirubin Total: <0.2 mg/dL (ref 0.0–1.2)
CO2: 28 mmol/L (ref 20–29)
Calcium: 9.4 mg/dL (ref 8.7–10.2)
Chloride: 106 mmol/L (ref 96–106)
Creatinine, Ser: 0.7 mg/dL (ref 0.57–1.00)
Globulin, Total: 2 g/dL (ref 1.5–4.5)
Glucose: 109 mg/dL — ABNORMAL HIGH (ref 70–99)
Potassium: 4.1 mmol/L (ref 3.5–5.2)
Sodium: 147 mmol/L — ABNORMAL HIGH (ref 134–144)
Total Protein: 6.2 g/dL (ref 6.0–8.5)
eGFR: 100 mL/min/{1.73_m2} (ref >59)

## 2021-09-29 LAB — CBC WITH DIFFERENTIAL/PLATELET
Basophils Absolute: 0 10*3/uL (ref 0.0–0.2)
Basos: 0 %
EOS (ABSOLUTE): 0.1 10*3/uL (ref 0.0–0.4)
Eos: 1 %
Hematocrit: 39 % (ref 34.0–46.6)
Hemoglobin: 13.1 g/dL (ref 11.1–15.9)
Immature Grans (Abs): 0 10*3/uL (ref 0.0–0.1)
Immature Granulocytes: 0 %
Lymphocytes Absolute: 2.2 10*3/uL (ref 0.7–3.1)
Lymphs: 32 %
MCH: 31.3 pg (ref 26.6–33.0)
MCHC: 33.6 g/dL (ref 31.5–35.7)
MCV: 93 fL (ref 79–97)
Monocytes Absolute: 0.5 10*3/uL (ref 0.1–0.9)
Monocytes: 8 %
Neutrophils Absolute: 4 10*3/uL (ref 1.4–7.0)
Neutrophils: 59 %
Platelets: 250 10*3/uL (ref 150–450)
RBC: 4.19 x10E6/uL (ref 3.77–5.28)
RDW: 12.3 % (ref 11.7–15.4)
WBC: 6.8 10*3/uL (ref 3.4–10.8)

## 2021-09-29 LAB — LIPID PANEL
Chol/HDL Ratio: 4.2 ratio (ref 0.0–4.4)
Cholesterol, Total: 211 mg/dL — ABNORMAL HIGH (ref 100–199)
HDL: 50 mg/dL (ref >39)
LDL Chol Calc (NIH): 131 mg/dL — ABNORMAL HIGH (ref 0–99)
Triglycerides: 166 mg/dL — ABNORMAL HIGH (ref 0–149)
VLDL Cholesterol Cal: 30 mg/dL (ref 5–40)

## 2021-10-29 ENCOUNTER — Other Ambulatory Visit: Payer: Self-pay | Admitting: Nurse Practitioner

## 2021-10-29 DIAGNOSIS — N3281 Overactive bladder: Secondary | ICD-10-CM

## 2022-01-15 ENCOUNTER — Ambulatory Visit: Payer: 59 | Admitting: Nurse Practitioner

## 2022-01-15 ENCOUNTER — Other Ambulatory Visit: Payer: Self-pay | Admitting: Nurse Practitioner

## 2022-01-15 ENCOUNTER — Encounter: Payer: Self-pay | Admitting: Nurse Practitioner

## 2022-01-15 ENCOUNTER — Other Ambulatory Visit: Payer: Self-pay | Admitting: *Deleted

## 2022-01-15 DIAGNOSIS — Z1231 Encounter for screening mammogram for malignant neoplasm of breast: Secondary | ICD-10-CM

## 2022-01-15 DIAGNOSIS — H1033 Unspecified acute conjunctivitis, bilateral: Secondary | ICD-10-CM

## 2022-01-15 MED ORDER — OFLOXACIN 0.3 % OP SOLN: 1.0000 [drp] | Freq: Four times a day (QID) | OPHTHALMIC | 0 refills | Status: DC

## 2022-01-15 MED ORDER — OFLOXACIN 0.3 % OT SOLN: 5.0000 [drp] | Freq: Every day | OTIC | 0 refills | Status: DC

## 2022-01-15 MED ORDER — FLUCONAZOLE 150 MG PO TABS
ORAL_TABLET | ORAL | 0 refills | Status: DC
Start: 2022-01-15 — End: 2023-01-25

## 2022-01-15 NOTE — Progress Notes (Unsigned)
Ear drops were sent , please change to eye drops and resend to [facility]

## 2022-01-15 NOTE — Patient Instructions (Signed)
Bacterial Conjunctivitis, Adult Bacterial conjunctivitis is an infection of your conjunctiva. This is the clear membrane that covers the white part of your eye and the inner part of your eyelid. This infection can make your eye: Red or pink. Itchy or irritated. This condition spreads easily from person to person (is contagious) and from one eye to the other eye. What are the causes? This condition is caused by germs (bacteria). You may get the infection if you come into close contact with: A person who has the infection. Items that have germs on them (are contaminated), such as face towels, contact lens solution, or eye makeup. What increases the risk? You are more likely to get this condition if: You have contact with people who have the infection. You wear contact lenses. You have a sinus infection. You have had a recent eye injury or surgery. You have a weak body defense system (immune system). You have dry eyes. What are the signs or symptoms?  Thick, yellowish discharge from the eye. Tearing or watery eyes. Itchy eyes. Burning feeling in your eyes. Eye redness. Swollen eyelids. Blurred vision. How is this treated?  Antibiotic eye drops or ointment. Antibiotic medicine taken by mouth. This is used for infections that do not get better with drops or ointment or that last more than 10 days. Cool, wet cloths placed on the eyes. Artificial tears used 2-6 times a day. Follow these instructions at home: Medicines Take or apply your antibiotic medicine as told by your doctor. Do not stop using it even if you start to feel better. Take or apply over-the-counter and prescription medicines only as told by your doctor. Do not touch your eyelid with the eye-drop bottle or the ointment tube. Managing discomfort Wipe any fluid from your eye with a warm, wet washcloth or a cotton ball. Place a clean, cool, wet cloth on your eye. Do this for 10-20 minutes, 3-4 times a day. General  instructions Do not wear contacts until the infection is gone. Wear glasses until your doctor says it is okay to wear contacts again. Do not wear eye makeup until the infection is gone. Throw away old eye makeup. Change or wash your pillowcase every day. Do not share towels or washcloths. Wash your hands often with soap and water for at least 20 seconds and especially before touching your face or eyes. Use paper towels to dry your hands. Do not touch or rub your eyes. Do not drive or use heavy machinery if your vision is blurred. Contact a doctor if: You have a fever. You do not get better after 10 days. Get help right away if: You have a fever and your symptoms get worse all of a sudden. You have very bad pain when you move your eye. Your face: Hurts. Is red. Is swollen. You have sudden loss of vision. Summary Bacterial conjunctivitis is an infection of your conjunctiva. This infection spreads easily from person to person. Wash your hands often with soap and water for at least 20 seconds and especially before touching your face or eyes. Use paper towels to dry your hands. Take or apply your antibiotic medicine as told by your doctor. Contact a doctor if you have a fever or you do not get better after 10 days. This information is not intended to replace advice given to you by your health care provider. Make sure you discuss any questions you have with your health care provider. Document Revised: 09/24/2020 Document Reviewed: 09/24/2020 Elsevier Patient Education    2023 [employer] Inc[employer]  

## 2022-01-15 NOTE — Progress Notes (Signed)
corrected

## 2022-01-15 NOTE — Progress Notes (Addendum)
Virtual Visit  Note Due to COVID-19 pandemic this visit was conducted virtually. This visit type was conducted due to national recommendations for restrictions regarding the COVID-19 Pandemic (e.g. social distancing, sheltering in place) in an effort to limit this patient's exposure and mitigate transmission in our community. All issues noted in this document were discussed and addressed.  A physical exam was not performed with this format.  I connected with Eileen Arnold on 01/15/22 at 1:12 by telephone and verified that I am speaking with the correct person using two identifiers. Eileen Arnold is currently located at home and no one is currently with her during visit. The provider, Mary-Margaret Hassell Done, FNP is located in their office at time of visit.  I discussed the limitations, risks, security and privacy concerns of performing an evaluation and management service by telephone and the availability of in person appointments. I also discussed with the patient that there may be a patient responsible charge related to this service. The patient expressed understanding and agreed to proceed.   History and Present Illness:  Conjunctivitis  The current episode started 2 days ago. The onset was sudden. The problem has been gradually worsening. The problem is moderate. Nothing relieves the symptoms. Nothing aggravates the symptoms. Associated symptoms include photophobia, eye discharge, eye pain and eye redness. Pertinent negatives include no fever, no decreased vision, no double vision, no eye itching, no congestion, no ear pain and no rhinorrhea. The eye pain is moderate. Both eyes are affected. The eye pain is not associated with movement. The eyelid exhibits redness.      Review of Systems  Constitutional:  Negative for fever.  HENT:  Negative for congestion, ear pain and rhinorrhea.   Eyes:  Positive for photophobia, pain, discharge and redness. Negative for double vision and itching.      Observations/Objective: Alert and oriented- answers all questions appropriately No distress Patient states "white of eyes are red"  Assessment and Plan: Eileen Arnold in today with chief complaint of Conjunctivitis   1. Acute bacterial conjunctivitis of both eyes Good hand washing' avoid rubbing eye Cool compresses as needed RTO prn Meds ordered this encounter  Medications   DISCONTD: ofloxacin (FLOXIN OTIC) 0.3 % OTIC solution    Sig: Place 5 drops into both ears daily.    Dispense:  10 mL    Refill:  0    Order Specific Question:   Supervising Provider    Answer:   Caryl Pina A [[id number]]   fluconazole (DIFLUCAN) 150 MG tablet    Sig: 1 po q week x 4 weeks    Dispense:  4 tablet    Refill:  0    Order Specific Question:   Supervising Provider    Answer:   Caryl Pina A [[id number]]   ofloxacin (OCUFLOX) 0.3 % ophthalmic solution    Sig: Place 1 drop into both eyes 4 (four) times daily.    Dispense:  5 mL    Refill:  0    Order Specific Question:   Supervising Provider    Answer:   Caryl Pina A [[id number]]     Follow Up Instructions: prn    I discussed the assessment and treatment plan with the patient. The patient was provided an opportunity to ask questions and all were answered. The patient agreed with the plan and demonstrated an understanding of the instructions.   The patient was advised to call back or seek an in-person evaluation if the symptoms worsen or  if the condition fails to improve as anticipated.  The above assessment and management plan was discussed with the patient. The patient verbalized understanding of and has agreed to the management plan. Patient is aware to call the clinic if symptoms persist or worsen. Patient is aware when to return to the clinic for a follow-up visit. Patient educated on when it is appropriate to go to the emergency department.   Time call ended:  1:23  I provided 11 minutes of  non face-to-face  time during this encounter.    Mary-Margaret Hassell Done, FNP

## 2022-01-15 NOTE — Addendum Note (Signed)
Addended by: [name] on: 01/15/2022 01:47 PM   Modules accepted: Orders

## 2022-01-18 ENCOUNTER — Ambulatory Visit
Admission: RE | Admit: 2022-01-18 | Discharge: 2022-01-18 | Disposition: A | Discharge status: Home / Self Care | Payer: 59 | Source: Ambulatory Visit | Attending: Nurse Practitioner | Admitting: Nurse Practitioner

## 2022-01-18 DIAGNOSIS — Z1231 Encounter for screening mammogram for malignant neoplasm of breast: Secondary | ICD-10-CM

## 2022-02-10 IMAGING — MG MM DIGITAL SCREENING BILAT W/ TOMO AND CAD
8 series · 8 of 24 positions shown · non-contrast
Comparison: Previous exam(s).

ACR Breast Density Category a: The breast tissue is almost entirely
fatty.

CLINICAL DATA: Screening.

EXAM:
DIGITAL SCREENING BILATERAL MAMMOGRAM WITH TOMOSYNTHESIS AND CAD
TECHNIQUE: Bilateral screening digital craniocaudal and mediolateral oblique
mammograms were obtained. Bilateral screening digital breast
tomosynthesis was performed. The images were evaluated with
computer-aided detection.

[L CC synth-2D]
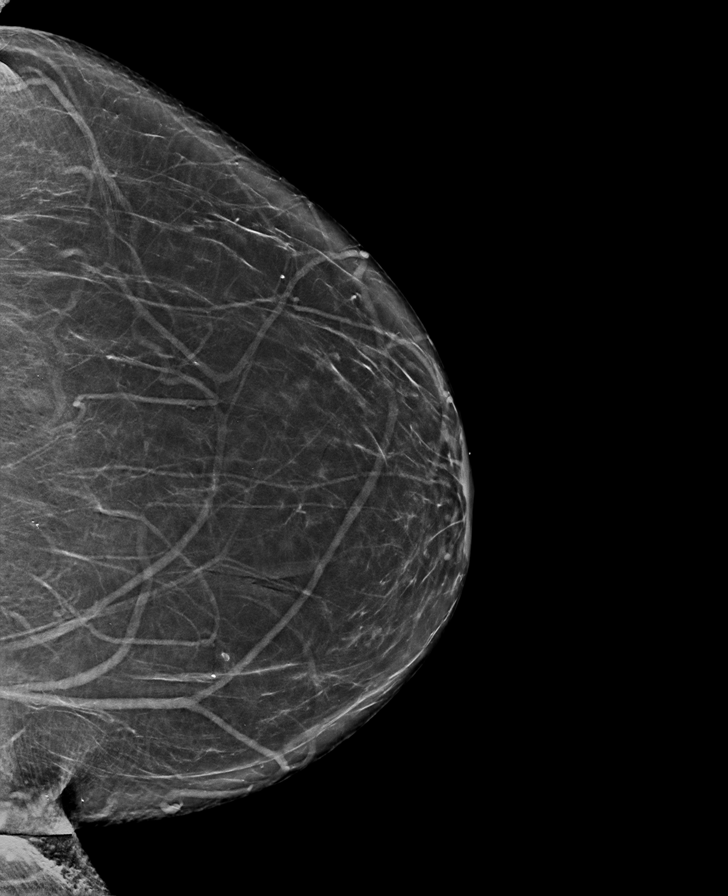

[L MLO synth-2D]
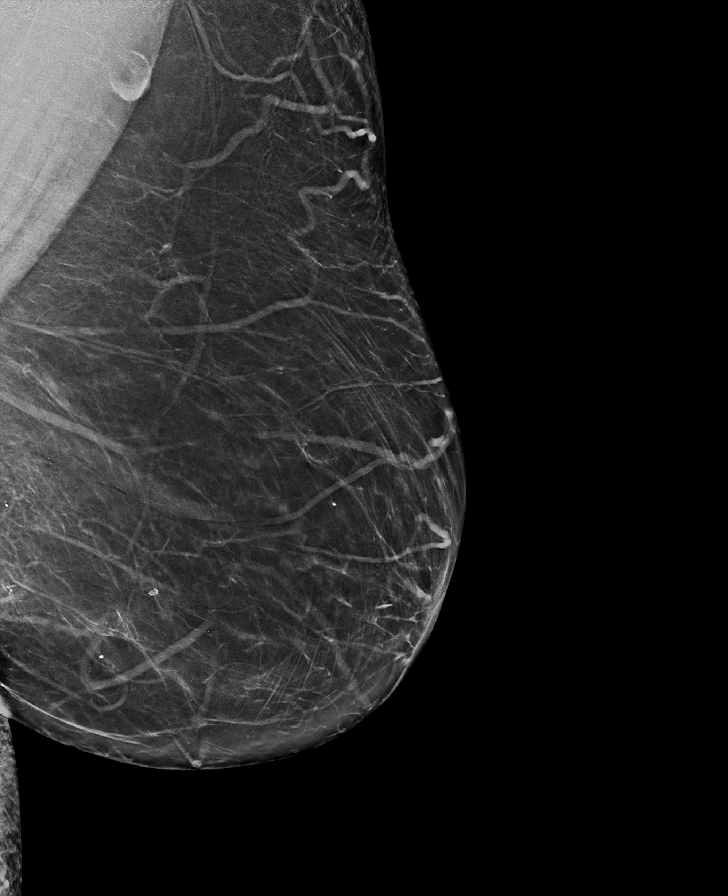

[R MLO synth-2D]
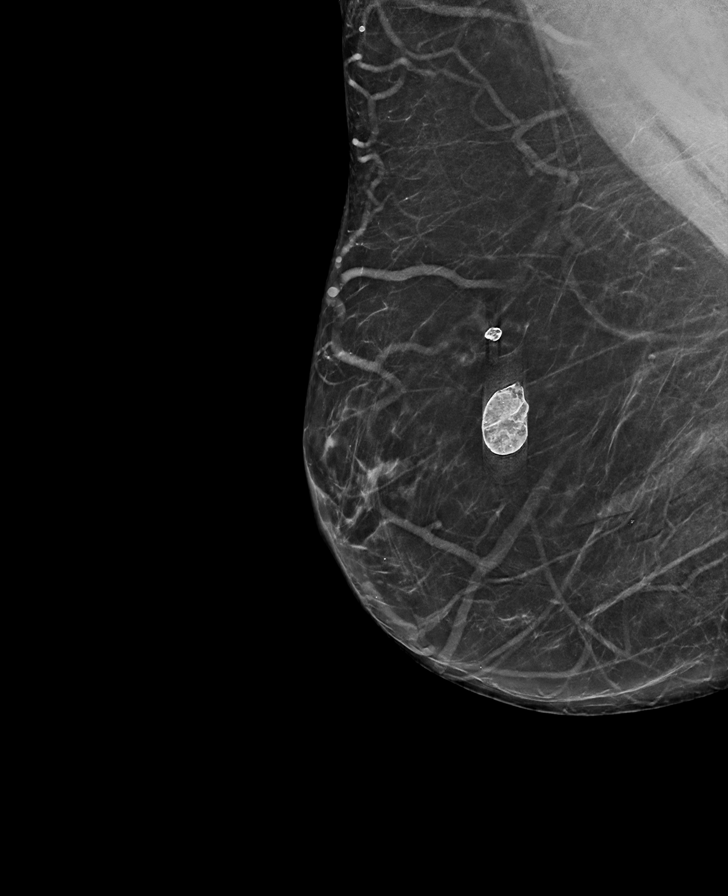

[R CC synth-2D]
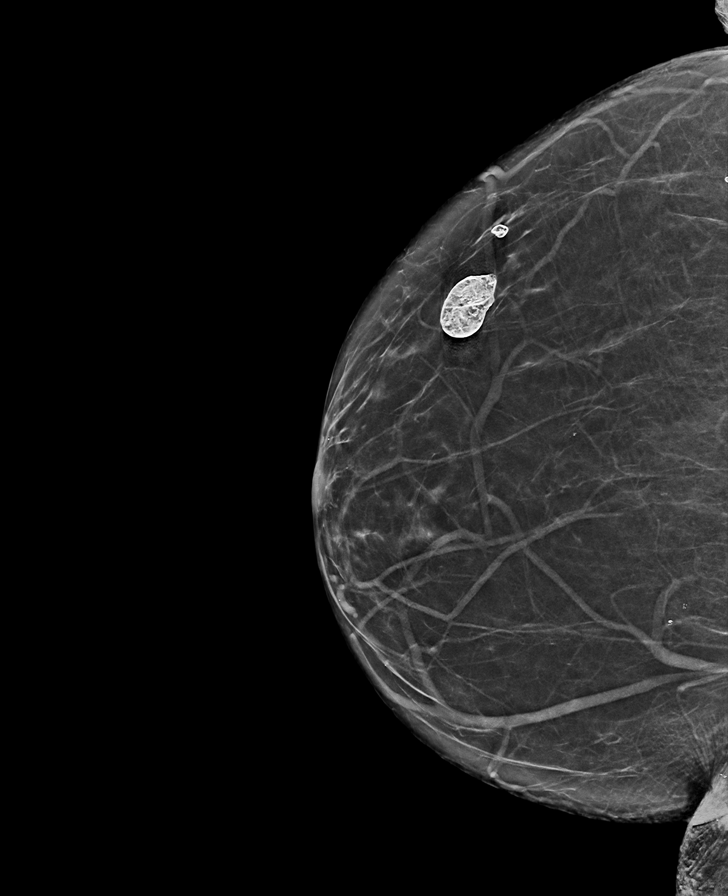

[R CC tomo · tomo slice 34/67.0]
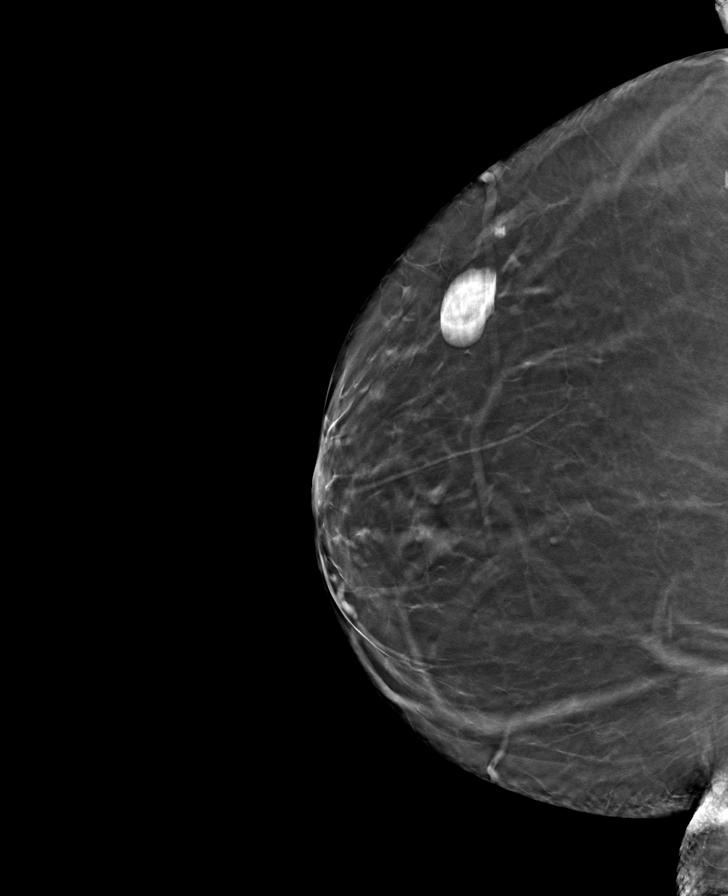

[L CC tomo · tomo slice 37/73.0]
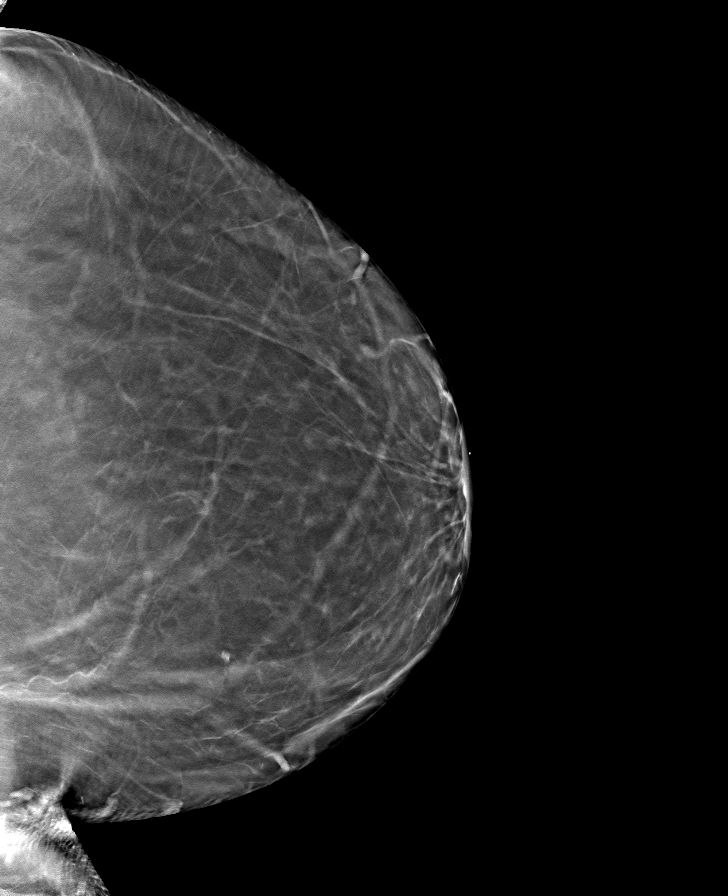

[R MLO tomo · tomo slice 37/73.0]
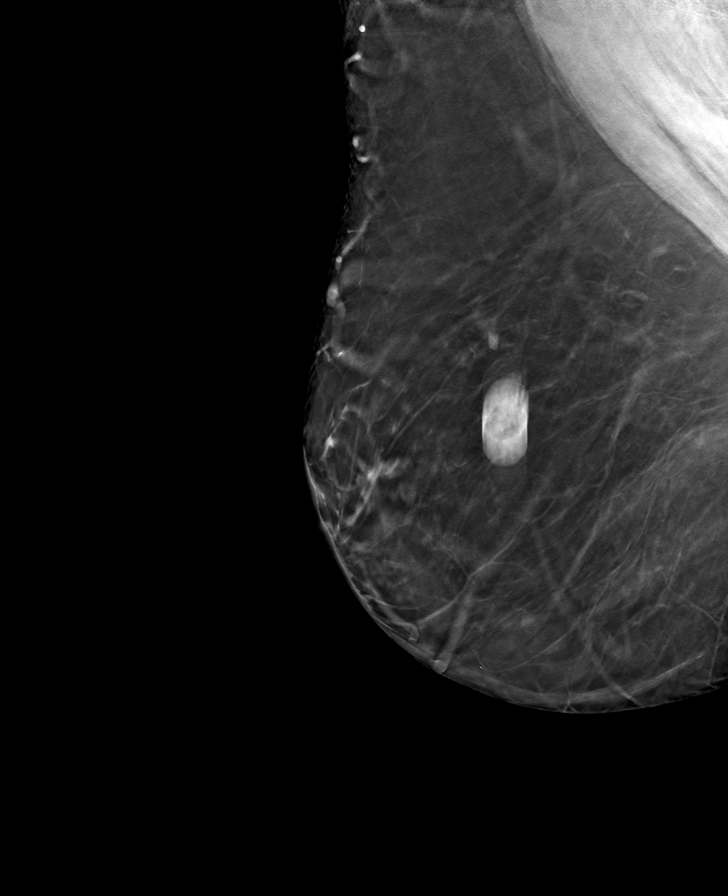

[L MLO tomo · tomo slice 40/79.0]
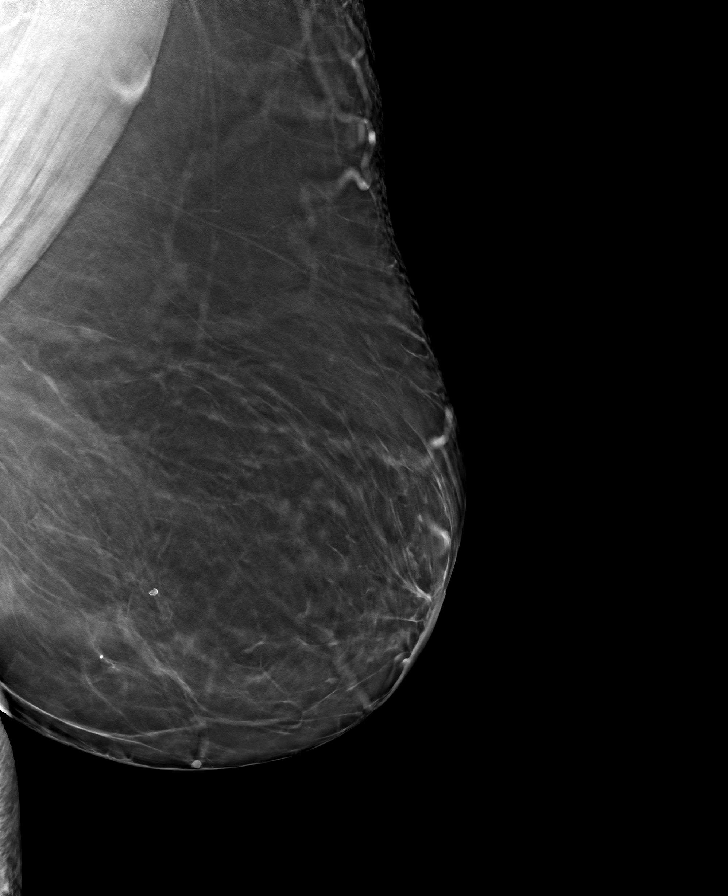

[8 of 24 positions shown; findings below may reference images not displayed]

FINDINGS: There are no findings suspicious for malignancy.
IMPRESSION: No mammographic evidence of malignancy. A result letter of this
screening mammogram will be mailed directly to the patient.

RECOMMENDATION:
Screening mammogram in one year. (Code:0E-3-N98)

BI-RADS CATEGORY  1: Negative.

## 2022-02-15 ENCOUNTER — Other Ambulatory Visit: Payer: Self-pay | Admitting: Nurse Practitioner

## 2022-02-15 DIAGNOSIS — M159 Polyosteoarthritis, unspecified: Secondary | ICD-10-CM

## 2022-03-15 ENCOUNTER — Other Ambulatory Visit: Payer: Self-pay | Admitting: Nurse Practitioner

## 2022-03-15 DIAGNOSIS — N3281 Overactive bladder: Secondary | ICD-10-CM

## 2022-03-16 ENCOUNTER — Encounter: Payer: Self-pay | Admitting: Nurse Practitioner

## 2022-03-16 ENCOUNTER — Ambulatory Visit: Payer: 59 | Admitting: Nurse Practitioner

## 2022-03-16 VITALS — BP 136/86 | HR 74 | Temp 98.1°F | Resp 20 | Ht 63.0 in | Wt 281.0 lb

## 2022-03-16 DIAGNOSIS — M545 Low back pain, unspecified: Secondary | ICD-10-CM

## 2022-03-16 DIAGNOSIS — Z6841 Body Mass Index (BMI) 40.0 and over, adult: Secondary | ICD-10-CM | POA: Diagnosis not present

## 2022-03-16 DIAGNOSIS — R739 Hyperglycemia, unspecified: Secondary | ICD-10-CM

## 2022-03-16 LAB — BAYER DCA HB A1C WAIVED: HB A1C (BAYER DCA - WAIVED): 5.5 % (ref 4.8–5.6)

## 2022-03-16 MED ORDER — METHYLPREDNISOLONE ACETATE 80 MG/ML IJ SUSP
80.0000 mg | Freq: Once | INTRAMUSCULAR | Status: AC
  Administered 2022-03-16: 80 mg via INTRAMUSCULAR

## 2022-03-16 MED ORDER — KETOROLAC TROMETHAMINE 60 MG/2ML IM SOLN
60.0000 mg | Freq: Once | INTRAMUSCULAR | Status: AC
  Administered 2022-03-16: 60 mg via INTRAMUSCULAR

## 2022-03-16 NOTE — Patient Instructions (Signed)
Semaglutide Injection (Weight Management) What is this medication? SEMAGLUTIDE (SEM a GLOO tide) promotes weight loss. It may also be used to maintain weight loss. It works by decreasing appetite. Changes to diet and exercise are often combined with this medication. This medicine may be used for other purposes; ask your health care provider or pharmacist if you have questions. COMMON BRAND NAME(S): FTDDUK What should I tell my care team before I take this medication? They need to know if you have any of these conditions: Endocrine tumors (MEN 2) or if someone in your family had these tumors Eye disease, vision problems Gallbladder disease History of depression or mental health disease History of pancreatitis Kidney disease Stomach or intestine problems Suicidal thoughts, plans, or attempt; a previous suicide attempt by you or a family member Thyroid cancer or if someone in your family had thyroid cancer An unusual or allergic reaction to semaglutide, other medications, foods, dyes, or preservatives Pregnant or trying to get pregnant Breast-feeding How should I use this medication? This medication is injected under the skin. You will be taught how to prepare and give it. Take it as directed on the prescription label. It is given once every week (every 7 days). Keep taking it unless your care team tells you to stop. It is important that you put your used needles and pens in a special sharps container. Do not put them in a trash can. If you do not have a sharps container, call your pharmacist or care team to get one. A special MedGuide will be given to you by the pharmacist with each prescription and refill. Be sure to read this information carefully each time. This medication comes with INSTRUCTIONS FOR USE. Ask your pharmacist for directions on how to use this medication. Read the information carefully. Talk to your pharmacist or care team if you have questions. Talk to your care team about  the use of this medication in children. While it may be prescribed for children as young as 12 years for selected conditions, precautions do apply. Overdosage: If you think you have taken too much of this medicine contact a poison control center or emergency room at once. NOTE: This medicine is only for you. Do not share this medicine with others. What if I miss a dose? If you miss a dose and the next scheduled dose is more than 2 days away, take the missed dose as soon as possible. If you miss a dose and the next scheduled dose is less than 2 days away, do not take the missed dose. Take the next dose at your regular time. Do not take double or extra doses. If you miss your dose for 2 weeks or more, take the next dose at your regular time or call your care team to talk about how to restart this medication. What may interact with this medication? Insulin and other medications for diabetes This list may not describe all possible interactions. Give your health care provider a list of all the medicines, herbs, non-prescription drugs, or dietary supplements you use. Also tell them if you smoke, drink alcohol, or use illegal drugs. Some items may interact with your medicine. What should I watch for while using this medication? Visit your care team for regular checks on your progress. It may be some time before you see the benefit from this medication. Drink plenty of fluids while taking this medication. Check with your care team if you have severe diarrhea, nausea, and vomiting, or if you sweat a  lot. The loss of too much body fluid may make it dangerous for you to take this medication. This medication may affect blood sugar levels. Ask your care team if changes in diet or medications are needed if you have diabetes. If you or your family notice any changes in your behavior, such as new or worsening depression, thoughts of harming yourself, anxiety, other unusual or disturbing thoughts, or memory loss, call  your care team right away. Women should inform their care team if they wish to become pregnant or think they might be pregnant. Losing weight while pregnant is not advised and may cause harm to the unborn child. Talk to your care team for more information. What side effects may I notice from receiving this medication? Side effects that you should report to your care team as soon as possible: Allergic reactions--skin rash, itching, hives, swelling of the face, lips, tongue, or throat Change in vision Dehydration--increased thirst, dry mouth, feeling faint or lightheaded, headache, dark yellow or brown urine Gallbladder problems--severe stomach pain, nausea, vomiting, fever Heart palpitations--rapid, pounding, or irregular heartbeat Kidney injury--decrease in the amount of urine, swelling of the ankles, hands, or feet Pancreatitis--severe stomach pain that spreads to your back or gets worse after eating or when touched, fever, nausea, vomiting Thoughts of suicide or self-harm, worsening mood, feelings of depression Thyroid cancer--new mass or lump in the neck, pain or trouble swallowing, trouble breathing, hoarseness Side effects that usually do not require medical attention (report to your care team if they continue or are bothersome): Diarrhea Loss of appetite Nausea Stomach pain Vomiting This list may not describe all possible side effects. Call your doctor for medical advice about side effects. You may report side effects to FDA at 1-800-FDA-1088. Where should I keep my medication? Keep out of the reach of children and pets. Refrigeration (preferred): Store in the refrigerator. Do not freeze. Keep this medication in the original container until you are ready to take it. Get rid of any unused medication after the expiration date. Room temperature: If needed, prior to cap removal, the pen can be stored at room temperature for up to 28 days. Protect from light. If it is stored at room  temperature, get rid of any unused medication after 28 days or after it expires, whichever is first. It is important to get rid of the medication as soon as you no longer need it or it is expired. You can do this in two ways: Take the medication to a medication take-back program. Check with your pharmacy or law enforcement to find a location. If you cannot return the medication, follow the directions in the MedGuide. NOTE: This sheet is a summary. It may not cover all possible information. If you have questions about this medicine, talk to your doctor, pharmacist, or health care provider.  2023 Elsevier/Gold Standard (2020-08-28 00:00:00)  

## 2022-03-16 NOTE — Progress Notes (Signed)
Subjective:    Patient ID: Eileen Arnold, female    DOB: [birth year], 61 y.o.   MRN: [id number]   Chief Complaint: Wants to be checked for diabetes   HPI Patient comes in today : -wanting to be checked for diabetes. Her last fasting blood sugar in office was 109. She has lots of family members with diabetes. She says she has gained weight and is not able to lose it. She would like an hgba1c done. - wants ozempic for weight loss. -Low/ back pain she has had for years. Pain has been worsening. Rates  pain 8/10 currently. Standing in one place increases pain. Is getting ready to go on trip and would like a shot to see if will help.   Review of Systems  Constitutional:  Negative for diaphoresis.  Eyes:  Negative for pain.  Respiratory:  Negative for shortness of breath.   Cardiovascular:  Negative for chest pain, palpitations and leg swelling.  Gastrointestinal:  Negative for abdominal pain.  Endocrine: Negative for polydipsia.  Musculoskeletal:  Positive for back pain.  Skin:  Negative for rash.  Neurological:  Negative for dizziness, weakness and headaches.  Hematological:  Does not bruise/bleed easily.  All other systems reviewed and are negative.      Objective:   Physical Exam Vitals and nursing note reviewed.  Constitutional:      General: She is not in acute distress.    Appearance: Normal appearance. She is well-developed.  Neck:     Vascular: No carotid bruit or JVD.  Cardiovascular:     Rate and Rhythm: Normal rate and regular rhythm.     Heart sounds: Normal heart sounds.  Pulmonary:     Effort: Pulmonary effort is normal. No respiratory distress.     Breath sounds: Normal breath sounds. No wheezing or rales.  Chest:     Chest wall: No tenderness.  Abdominal:     General: Bowel sounds are normal. There is no distension or abdominal bruit.     Palpations: Abdomen is soft. There is no hepatomegaly, splenomegaly, mass or pulsatile mass.     Tenderness: There is  no abdominal tenderness.  Musculoskeletal:        General: Normal range of motion.     Cervical back: Normal range of motion and neck supple.  Lymphadenopathy:     Cervical: No cervical adenopathy.  Skin:    General: Skin is warm and dry.  Neurological:     Mental Status: She is alert and oriented to person, place, and time.     Deep Tendon Reflexes: Reflexes are normal and symmetric.  Psychiatric:        Behavior: Behavior normal.        Thought Content: Thought content normal.        Judgment: Judgment normal.     BP 136/86   Pulse 74   Temp 98.1 F (36.7 C) (Temporal)   Resp 20   Ht '5\' 3"'$  (1.6 m)   Wt 281 lb (127.5 kg)   SpO2 94%   BMI 49.78 kg/m         Assessment & Plan:  Eileen Arnold in today with chief complaint of Wants to be checked for diabetes   1. Elevated blood sugar Labs pending - Bayer DCA Hb A1c Waived - CBC with Differential/Platelet - CMP14+EGFR  2. Acute midline low back pain without sciatica Moist heat' rest - methylPREDNISolone acetate (DEPO-MEDROL) injection 80 mg - ketorolac (TORADOL) injection 60 mg  3. Morbid obesity Will discuss wegovey once labs are back  The above assessment and management plan was discussed with the patient. The patient verbalized understanding of and has agreed to the management plan. Patient is aware to call the clinic if symptoms persist or worsen. Patient is aware when to return to the clinic for a follow-up visit. Patient educated on when it is appropriate to go to the emergency department.   Mary-Margaret Hassell Done, FNP

## 2022-03-17 LAB — CMP14+EGFR
ALT: 21 IU/L (ref 0–32)
AST: 19 IU/L (ref 0–40)
Albumin/Globulin Ratio: 1.8 (ref 1.2–2.2)
Albumin: 4.2 g/dL (ref 3.8–4.9)
Alkaline Phosphatase: 77 IU/L (ref 44–121)
BUN/Creatinine Ratio: 19 (ref 12–28)
BUN: 16 mg/dL (ref 8–27)
Bilirubin Total: 0.4 mg/dL (ref 0.0–1.2)
CO2: 28 mmol/L (ref 20–29)
Calcium: 9 mg/dL (ref 8.7–10.3)
Chloride: 101 mmol/L (ref 96–106)
Creatinine, Ser: 0.86 mg/dL (ref 0.57–1.00)
Globulin, Total: 2.3 g/dL (ref 1.5–4.5)
Glucose: 88 mg/dL (ref 70–99)
Potassium: 4.2 mmol/L (ref 3.5–5.2)
Sodium: 144 mmol/L (ref 134–144)
Total Protein: 6.5 g/dL (ref 6.0–8.5)
eGFR: 77 mL/min/{1.73_m2} (ref >59)

## 2022-03-17 LAB — CBC WITH DIFFERENTIAL/PLATELET
Basophils Absolute: 0 10*3/uL (ref 0.0–0.2)
Basos: 0 %
EOS (ABSOLUTE): 0 10*3/uL (ref 0.0–0.4)
Eos: 1 %
Hematocrit: 39.5 % (ref 34.0–46.6)
Hemoglobin: 13.1 g/dL (ref 11.1–15.9)
Immature Grans (Abs): 0 10*3/uL (ref 0.0–0.1)
Immature Granulocytes: 0 %
Lymphocytes Absolute: 2.3 10*3/uL (ref 0.7–3.1)
Lymphs: 31 %
MCH: 31.1 pg (ref 26.6–33.0)
MCHC: 33.2 g/dL (ref 31.5–35.7)
MCV: 94 fL (ref 79–97)
Monocytes Absolute: 0.6 10*3/uL (ref 0.1–0.9)
Monocytes: 8 %
Neutrophils Absolute: 4.4 10*3/uL (ref 1.4–7.0)
Neutrophils: 60 %
Platelets: 239 10*3/uL (ref 150–450)
RBC: 4.21 x10E6/uL (ref 3.77–5.28)
RDW: 12.9 % (ref 11.7–15.4)
WBC: 7.3 10*3/uL (ref 3.4–10.8)

## 2022-03-19 MED ORDER — PREDNISONE 10 MG (21) PO TBPK: ORAL_TABLET | ORAL | 0 refills | Status: DC

## 2022-03-19 NOTE — Addendum Note (Signed)
Addended by: [name] on: 03/19/2022 04:10 PM   Modules accepted: Orders

## 2022-06-07 ENCOUNTER — Other Ambulatory Visit: Payer: Self-pay | Admitting: Nurse Practitioner

## 2022-06-07 DIAGNOSIS — N3281 Overactive bladder: Secondary | ICD-10-CM

## 2022-06-11 ENCOUNTER — Encounter: Payer: Self-pay | Admitting: Nurse Practitioner

## 2022-06-11 ENCOUNTER — Ambulatory Visit: Payer: 59 | Admitting: Nurse Practitioner

## 2022-06-11 DIAGNOSIS — R300 Dysuria: Secondary | ICD-10-CM | POA: Diagnosis not present

## 2022-06-11 DIAGNOSIS — N3000 Acute cystitis without hematuria: Secondary | ICD-10-CM | POA: Diagnosis not present

## 2022-06-11 LAB — URINALYSIS, COMPLETE
Bilirubin, UA: NEGATIVE
Glucose, UA: NEGATIVE
Ketones, UA: NEGATIVE
Nitrite, UA: NEGATIVE
Protein,UA: NEGATIVE
Specific Gravity, UA: 1.02 (ref 1.005–1.030)
Urobilinogen, Ur: 0.2 mg/dL (ref 0.2–1.0)
pH, UA: 7 (ref 5.0–7.5)

## 2022-06-11 LAB — MICROSCOPIC EXAMINATION
RBC, Urine: NONE SEEN /hpf (ref 0–2)
Renal Epithel, UA: NONE SEEN /hpf

## 2022-06-11 MED ORDER — CEPHALEXIN 500 MG PO CAPS: 500.0000 mg | ORAL_CAPSULE | Freq: Two times a day (BID) | ORAL | 0 refills | Status: DC

## 2022-06-11 NOTE — Progress Notes (Signed)
   Virtual Visit  Note Due to COVID-19 pandemic this visit was conducted virtually. This visit type was conducted due to national recommendations for restrictions regarding the COVID-19 Pandemic (e.g. social distancing, sheltering in place) in an effort to limit this patient's exposure and mitigate transmission in our community. All issues noted in this document were discussed and addressed.  A physical exam was not performed with this format.  I connected with Eileen Arnold on 06/11/22 at 9:06 by telephone and verified that I am speaking with the correct person using two identifiers. Eileen Arnold is currently located at home and no one  is currently with her during visit. The provider, Mary-Margaret Hassell Done, FNP is located in their office at time of visit.  I discussed the limitations, risks, security and privacy concerns of performing an evaluation and management service by telephone and the availability of in person appointments. I also discussed with the patient that there may be a patient responsible charge related to this service. The patient expressed understanding and agreed to proceed.   History and Present Illness:  Urinary Tract Infection  This is a recurrent problem. The current episode started yesterday. The problem has been gradually worsening. The quality of the pain is described as burning. The pain is at a severity of 6/10. The pain is moderate. There has been no fever. She is Sexually active. Associated symptoms include frequency, hesitancy and urgency. Pertinent negatives include no discharge or flank pain. She has tried nothing for the symptoms. The treatment provided mild relief.      Review of Systems  Genitourinary:  Positive for frequency, hesitancy and urgency. Negative for flank pain.     Observations/Objective: Alert and oriented- answers all questions appropriately No distress No back pain  Assessment and Plan: Eileen Arnold in today with chief  complaint of Urinary Tract Infection   1. Acute cystitis without hematuria Take medication as prescribe Cotton underwear Take shower not bath Cranberry juice, yogurt Force fluids AZO over the counter X2 days Culture pending RTO prn   2. Dysuria - Urinalysis, Complete - Urine Culture   Follow Up Instructions: prn    I discussed the assessment and treatment plan with the patient. The patient was provided an opportunity to ask questions and all were answered. The patient agreed with the plan and demonstrated an understanding of the instructions.   The patient was advised to call back or seek an in-person evaluation if the symptoms worsen or if the condition fails to improve as anticipated.  The above assessment and management plan was discussed with the patient. The patient verbalized understanding of and has agreed to the management plan. Patient is aware to call the clinic if symptoms persist or worsen. Patient is aware when to return to the clinic for a follow-up visit. Patient educated on when it is appropriate to go to the emergency department.   Time call ended:  9/18  I provided 12 minutes of  non face-to-face time during this encounter.    Mary-Margaret Hassell Done, FNP

## 2022-06-11 NOTE — Patient Instructions (Signed)
Take medication as prescribe Cotton underwear Take shower not bath Cranberry juice, yogurt Force fluids AZO over the counter X2 days Culture pending RTO prn  

## 2022-06-15 LAB — URINE CULTURE

## 2022-08-26 ENCOUNTER — Encounter: Payer: Self-pay | Admitting: Nurse Practitioner

## 2022-08-26 ENCOUNTER — Telehealth: Payer: 59 | Admitting: Nurse Practitioner

## 2022-08-26 DIAGNOSIS — J0100 Acute maxillary sinusitis, unspecified: Secondary | ICD-10-CM | POA: Diagnosis not present

## 2022-08-26 MED ORDER — AMOXICILLIN-POT CLAVULANATE 875-125 MG PO TABS: 1.0000 | ORAL_TABLET | Freq: Two times a day (BID) | ORAL | 0 refills | Status: DC

## 2022-08-26 NOTE — Progress Notes (Signed)
Virtual Visit Consent   Eileen Arnold, you are scheduled for a virtual visit with Mary-Margaret Hassell Done, [place], a [facility] provider, today.     Just as with appointments in the office, your consent must be obtained to participate.  Your consent will be active for this visit and any virtual visit you may have with one of our providers in the next 365 days.     If you have a MyChart account, a copy of this consent can be sent to you electronically.  All virtual visits are billed to your insurance company just like a traditional visit in the office.    As this is a virtual visit, video technology does not allow for your provider to perform a traditional examination.  This may limit your provider's ability to fully assess your condition.  If your provider identifies any concerns that need to be evaluated in person or the need to arrange testing (such as labs, EKG, etc.), we will make arrangements to do so.     Although advances in technology are sophisticated, we cannot ensure that it will always work on either your end or our end.  If the connection with a video visit is poor, the visit may have to be switched to a telephone visit.  With either a video or telephone visit, we are not always able to ensure that we have a secure connection.     I need to obtain your verbal consent now.   Are you willing to proceed with your visit today? YES   Eileen Arnold has provided verbal consent on 08/26/2022 for a virtual visit (video or telephone).   Mary-Margaret Hassell Done, FNP   Date: 08/26/2022 9:20 AM   Virtual Visit via Video Note   I, Mary-Margaret Hassell Done, connected with Eileen Arnold ([id number], 26-Mar-1961) on 08/26/22 at  4:30 PM EST by a video-enabled telemedicine application and verified that I am speaking with the correct person using two identifiers.  Location: Patient: Virtual Visit Location Patient: Home Provider: Virtual Visit Location Provider: Mobile   I discussed the limitations  of evaluation and management by telemedicine and the availability of in person appointments. The patient expressed understanding and agreed to proceed.    History of Present Illness: Eileen Arnold is a 62 y.o. who identifies as a female who was assigned female at birth, and is being seen today for sinusitis.  HPI: Sinusitis This is a new problem. Episode onset: 3days. The problem has been waxing and waning since onset. There has been no fever. Her pain is at a severity of 7/10. The pain is moderate. Associated symptoms include congestion, coughing, headaches and sinus pressure. Pertinent negatives include no sore throat. Past treatments include acetaminophen (OTC cold and flu pills). The treatment provided mild relief.    Review of Systems  HENT:  Positive for congestion and sinus pressure. Negative for sore throat.   Respiratory:  Positive for cough.   Neurological:  Positive for headaches.    Problems:  Patient Active Problem List   Diagnosis Date Noted   Rectal bleeding 05/02/2018   History of colonic polyps 05/02/2018   Osteoarthritis 10/25/2016   Overactive bladder 10/25/2016   Morbid obesity with BMI of 45.0-49.9, adult ([place]) 10/25/2016   Family hx of colon cancer 10/29/2015    Allergies: No Known Allergies Medications:  Current Outpatient Medications:    acetaminophen (TYLENOL) 500 MG tablet, Take 1,000 mg by mouth 3 (three) times daily as needed for moderate pain or  headache., Disp: , Rfl:    celecoxib (CELEBREX) 200 MG capsule, Take 1 capsule (200 mg total) by mouth 2 (two) times daily. (NEEDS TO BE SEEN BEFORE NEXT REFILL), Disp: 60 capsule, Rfl: 0   cephALEXin (KEFLEX) 500 MG capsule, Take 1 capsule (500 mg total) by mouth 2 (two) times daily., Disp: 14 capsule, Rfl: 0   cholecalciferol (VITAMIN D3) 25 MCG (1000 UT) tablet, Take 1,000 Units by mouth daily., Disp: , Rfl:    fluconazole (DIFLUCAN) 150 MG tablet, 1 po q week x 4 weeks, Disp: 4 tablet, Rfl: 0   Multiple  Vitamin (MULTIVITAMIN WITH MINERALS) TABS tablet, Take 1 tablet by mouth daily., Disp: , Rfl:    ofloxacin (OCUFLOX) 0.3 % ophthalmic solution, Place 1 drop into both eyes 4 (four) times daily., Disp: 5 mL, Rfl: 0   Omega-3 Fatty Acids (FISH OIL) 1000 MG CAPS, Take 1,000 mg by mouth daily., Disp: , Rfl:    predniSONE (STERAPRED UNI-PAK 21 TAB) 10 MG (21) TBPK tablet, As directed x 6 days, Disp: 21 tablet, Rfl: 0   Probiotic CAPS, Take 1 capsule by mouth daily., Disp: , Rfl:    solifenacin (VESICARE) 5 MG tablet, TAKE ONE TABLET BY MOUTH EVERY DAY, Disp: 90 tablet, Rfl: 0   vitamin B-12 (CYANOCOBALAMIN) 500 MCG tablet, Take 500 mcg by mouth daily., Disp: , Rfl:    vitamin B-6 (PYRIDOXINE) 25 MG tablet, Take 25 mg by mouth daily., Disp: , Rfl:   Observations/Objective: Patient is well-developed, well-nourished in no acute distress.  Resting comfortably  at home.  Head is normocephalic, atraumatic.  No labored breathing.  Speech is clear and coherent with logical content.  Patient is alert and oriented at baseline.  Maxillary sinus pressure  Assessment and Plan:  Eileen Arnold in today with chief complaint of Sinusitis   1. Acute non-recurrent maxillary sinusitis 1. Take meds as prescribed 2. Use a cool mist humidifier especially during the winter months and when heat has been humid. 3. Use saline nose sprays frequently 4. Saline irrigations of the nose can be very helpful if done frequently.  * 4X daily for 1 week*  * Use of a nettie pot can be helpful with this. Follow directions with this* 5. Drink plenty of fluids 6. Keep thermostat turn down low 7.For any cough or congestion- mucinex OTC 8. For fever or aces or pains- take tylenol or ibuprofen appropriate for age and weight.  * for fevers greater than 101 orally you may alternate ibuprofen and tylenol every  3 hours.    - amoxicillin-clavulanate (AUGMENTIN) 875-125 MG tablet; Take 1 tablet by mouth 2 (two) times daily.   Dispense: 14 tablet; Refill: 0   Follow Up Instructions: I discussed the assessment and treatment plan with the patient. The patient was provided an opportunity to ask questions and all were answered. The patient agreed with the plan and demonstrated an understanding of the instructions.  A copy of instructions were sent to the patient via MyChart.  The patient was advised to call back or seek an in-person evaluation if the symptoms worsen or if the condition fails to improve as anticipated.  Time:  I spent 7 minutes with the patient via telehealth technology discussing the above problems/concerns.    Mary-Margaret Hassell Done, FNP

## 2022-08-26 NOTE — Patient Instructions (Signed)
Eileen Arnold, thank you for joining [name], FNP for today's virtual visit.  While this provider is not your primary care provider (PCP), if your PCP is located in our provider database this encounter information will be shared with them immediately following your visit.   [facility] account gives you access to today's visit and all your visits, tests, and labs performed at [facility] " click here if you don't have a Eileen Arnold account or go to [email or web address]  Consent: (Patient) Eileen Arnold provided verbal consent for this virtual visit at the beginning of the encounter.  Current Medications:  Current Outpatient Medications:    amoxicillin-clavulanate (AUGMENTIN) 875-125 MG tablet, Take 1 tablet by mouth 2 (two) times daily., Disp: 14 tablet, Rfl: 0   acetaminophen (TYLENOL) 500 MG tablet, Take 1,000 mg by mouth 3 (three) times daily as needed for moderate pain or headache., Disp: , Rfl:    celecoxib (CELEBREX) 200 MG capsule, Take 1 capsule (200 mg total) by mouth 2 (two) times daily. (NEEDS TO BE SEEN BEFORE NEXT REFILL), Disp: 60 capsule, Rfl: 0   cephALEXin (KEFLEX) 500 MG capsule, Take 1 capsule (500 mg total) by mouth 2 (two) times daily., Disp: 14 capsule, Rfl: 0   cholecalciferol (VITAMIN D3) 25 MCG (1000 UT) tablet, Take 1,000 Units by mouth daily., Disp: , Rfl:    fluconazole (DIFLUCAN) 150 MG tablet, 1 po q week x 4 weeks, Disp: 4 tablet, Rfl: 0   Multiple Vitamin (MULTIVITAMIN WITH MINERALS) TABS tablet, Take 1 tablet by mouth daily., Disp: , Rfl:    ofloxacin (OCUFLOX) 0.3 % ophthalmic solution, Place 1 drop into both eyes 4 (four) times daily., Disp: 5 mL, Rfl: 0   Omega-3 Fatty Acids (FISH OIL) 1000 MG CAPS, Take 1,000 mg by mouth daily., Disp: , Rfl:    predniSONE (STERAPRED UNI-PAK 21 TAB) 10 MG (21) TBPK tablet, As directed x 6 days, Disp: 21 tablet, Rfl: 0   Probiotic CAPS, Take 1 capsule by mouth daily., Disp:  , Rfl:    solifenacin (VESICARE) 5 MG tablet, TAKE ONE TABLET BY MOUTH EVERY DAY, Disp: 90 tablet, Rfl: 0   vitamin B-12 (CYANOCOBALAMIN) 500 MCG tablet, Take 500 mcg by mouth daily., Disp: , Rfl:    vitamin B-6 (PYRIDOXINE) 25 MG tablet, Take 25 mg by mouth daily., Disp: , Rfl:    Medications ordered in this encounter:  Meds ordered this encounter  Medications   amoxicillin-clavulanate (AUGMENTIN) 875-125 MG tablet    Sig: Take 1 tablet by mouth 2 (two) times daily.    Dispense:  14 tablet    Refill:  0    Order Specific Question:   Supervising Provider    Answer:   Caryl Pina A [id number]     *If you need refills on other medications prior to your next appointment, please contact your pharmacy*  Follow-Up: Call back or seek an in-person evaluation if the symptoms worsen or if the condition fails to improve as anticipated.  [place]  Other Instructions 1. Take meds as prescribed 2. Use a cool mist humidifier especially during the winter months and when heat has been humid. 3. Use saline nose sprays frequently 4. Saline irrigations of the nose can be very helpful if done frequently.  * 4X daily for 1 week*  * Use of a nettie pot can be helpful with this. Follow directions with this* 5. Drink plenty of fluids 6. Keep thermostat turn  down low 7.For any cough or congestion- mucinex 8. For fever or aces or pains- take tylenol or ibuprofen appropriate for age and weight.  * for fevers greater than 101 orally you may alternate ibuprofen and tylenol every  3 hours.      If you have been instructed to have an in-person evaluation today at a local Urgent Care facility, please use the link below. It will take you to a list of all of our available [facility], including address, phone number and hours of operation. Please do not delay care.  [facility]  If you or a family member do not have a primary care provider, use the  link below to schedule a visit and establish care. When you choose a [facility] primary care physician or advanced practice provider, you gain a long-term partner in health. Find a Primary Care Provider  Learn more about [facility]'s in-office and virtual care options: [facility]

## 2022-09-27 ENCOUNTER — Encounter: Payer: Self-pay | Admitting: Nurse Practitioner

## 2022-09-27 ENCOUNTER — Ambulatory Visit: Payer: 59 | Admitting: Nurse Practitioner

## 2022-09-27 VITALS — BP 152/78 | HR 97 | Resp 20 | Ht 63.0 in | Wt 285.0 lb

## 2022-09-27 DIAGNOSIS — Z23 Encounter for immunization: Secondary | ICD-10-CM | POA: Diagnosis not present

## 2022-09-27 DIAGNOSIS — S81802A Unspecified open wound, left lower leg, initial encounter: Secondary | ICD-10-CM

## 2022-09-27 MED ORDER — CEPHALEXIN 500 MG PO CAPS: 500.0000 mg | ORAL_CAPSULE | Freq: Three times a day (TID) | ORAL | 0 refills | Status: DC

## 2022-09-27 NOTE — Progress Notes (Addendum)
Subjective:    Patient ID: Eileen Arnold, female    DOB: [birth year], 61 y.o.   MRN: [id number]   Chief Complaint: Left leg swollen and red [place]   HPI  Patient Active Problem List   Diagnosis Date Noted   Rectal bleeding 05/02/2018   History of colonic polyps 05/02/2018   Osteoarthritis 10/25/2016   Overactive bladder 10/25/2016   Morbid obesity with BMI of 45.0-49.9, adult 10/25/2016   Family hx of colon cancer 10/29/2015   Patient fell at [place] yesterday and scraped her left shin on carpet. Needs tetanus shot.    Review of Systems  Constitutional:  Negative for diaphoresis.  Eyes:  Negative for pain.  Respiratory:  Negative for shortness of breath.   Cardiovascular:  Negative for chest pain, palpitations and leg swelling.  Gastrointestinal:  Negative for abdominal pain.  Endocrine: Negative for polydipsia.  Skin:  Negative for rash.  Neurological:  Negative for dizziness, weakness and headaches.  Hematological:  Does not bruise/bleed easily.  All other systems reviewed and are negative.      Objective:   Physical Exam Vitals and nursing note reviewed.  Constitutional:      General: She is not in acute distress.    Appearance: Normal appearance. She is well-developed.  Neck:     Vascular: No carotid bruit or JVD.  Cardiovascular:     Rate and Rhythm: Normal rate and regular rhythm.     Heart sounds: Normal heart sounds.  Pulmonary:     Effort: Pulmonary effort is normal. No respiratory distress.     Breath sounds: Normal breath sounds. No wheezing or rales.  Chest:     Chest wall: No tenderness.  Abdominal:     General: Bowel sounds are normal. There is no distension or abdominal bruit.     Palpations: Abdomen is soft. There is no hepatomegaly, splenomegaly, mass or pulsatile mass.     Tenderness: There is no abdominal tenderness.  Musculoskeletal:        General: Normal range of motion.     Cervical back: Normal range of motion  and neck supple.  Lymphadenopathy:     Cervical: No cervical adenopathy.  Skin:    General: Skin is warm and dry.  Neurological:     Mental Status: She is alert and oriented to person, place, and time.     Deep Tendon Reflexes: Reflexes are normal and symmetric.  Psychiatric:        Behavior: Behavior normal.        Thought Content: Thought content normal.        Judgment: Judgment normal.    BP (!) 152/78   Pulse 97   Resp 20   Ht 5\' 3"  (1.6 m)   Wt 285 lb (129.3 kg)   SpO2 93%   BMI 50.49 kg/m          Assessment & Plan:    Eileen Arnold in today with chief complaint of Left leg swollen and red (Fell at [employer])   1. Wound of left lower extremity, initial encounter Keep clean and dry  Watch for signs of infection Take antibiotics if needed TD today  Meds ordered this encounter  Medications   cephALEXin (KEFLEX) 500 MG capsule    Sig: Take 1 capsule (500 mg total) by mouth 3 (three) times daily.    Dispense:  30 capsule    Refill:  0    Order Specific Question:   Supervising  Provider    Answer:   Worthy Rancher [id number]       The above assessment and management plan was discussed with the patient. The patient verbalized understanding of and has agreed to the management plan. Patient is aware to call the clinic if symptoms persist or worsen. Patient is aware when to return to the clinic for a follow-up visit. Patient educated on when it is appropriate to go to the emergency department.   Mary-Margaret Hassell Done, FNP

## 2022-09-27 NOTE — Addendum Note (Signed)
Addended by: [name] on: 09/27/2022 02:49 PM   Modules accepted: Level of Service

## 2022-09-30 NOTE — Addendum Note (Signed)
Addended by: Rolena Infante on: 09/30/2022 02:49 PM   Modules accepted: Orders

## 2022-10-23 ENCOUNTER — Other Ambulatory Visit: Payer: Self-pay | Admitting: Nurse Practitioner

## 2022-10-23 DIAGNOSIS — N3281 Overactive bladder: Secondary | ICD-10-CM

## 2022-11-15 ENCOUNTER — Encounter (HOSPITAL_COMMUNITY): Payer: Self-pay | Admitting: Physician Assistant

## 2022-11-15 ENCOUNTER — Other Ambulatory Visit (HOSPITAL_COMMUNITY): Payer: Self-pay | Admitting: Physician Assistant

## 2022-11-15 DIAGNOSIS — M25569 Pain in unspecified knee: Secondary | ICD-10-CM

## 2022-11-15 DIAGNOSIS — M7989 Other specified soft tissue disorders: Secondary | ICD-10-CM

## 2022-11-15 DIAGNOSIS — M25562 Pain in left knee: Secondary | ICD-10-CM

## 2022-11-18 ENCOUNTER — Ambulatory Visit (HOSPITAL_COMMUNITY)
Admission: RE | Admit: 2022-11-18 | Discharge: 2022-11-18 | Disposition: A | Discharge status: Home / Self Care | Payer: 59 | Source: Ambulatory Visit | Attending: Physician Assistant | Admitting: Physician Assistant

## 2022-11-18 DIAGNOSIS — M25562 Pain in left knee: Secondary | ICD-10-CM

## 2022-11-18 DIAGNOSIS — M25569 Pain in unspecified knee: Secondary | ICD-10-CM | POA: Diagnosis present

## 2022-11-18 DIAGNOSIS — M7989 Other specified soft tissue disorders: Secondary | ICD-10-CM | POA: Diagnosis present

## 2022-12-20 ENCOUNTER — Other Ambulatory Visit: Payer: Self-pay | Admitting: Nurse Practitioner

## 2022-12-20 DIAGNOSIS — N3281 Overactive bladder: Secondary | ICD-10-CM

## 2023-01-25 ENCOUNTER — Encounter: Payer: Self-pay | Admitting: Family

## 2023-01-25 ENCOUNTER — Ambulatory Visit: Payer: 59 | Admitting: Family

## 2023-01-25 VITALS — BP 123/70 | HR 91 | Temp 97.7°F | Ht 63.0 in | Wt 286.0 lb

## 2023-01-25 DIAGNOSIS — J208 Acute bronchitis due to other specified organisms: Secondary | ICD-10-CM

## 2023-01-25 DIAGNOSIS — B9689 Other specified bacterial agents as the cause of diseases classified elsewhere: Secondary | ICD-10-CM

## 2023-01-25 DIAGNOSIS — M25511 Pain in right shoulder: Secondary | ICD-10-CM | POA: Diagnosis not present

## 2023-01-25 DIAGNOSIS — R600 Localized edema: Secondary | ICD-10-CM

## 2023-01-25 DIAGNOSIS — S46211D Strain of muscle, fascia and tendon of other parts of biceps, right arm, subsequent encounter: Secondary | ICD-10-CM | POA: Diagnosis not present

## 2023-01-25 MED ORDER — METHYLPREDNISOLONE ACETATE 80 MG/ML IJ SUSP
80.0000 mg | Freq: Once | INTRAMUSCULAR | Status: AC
Start: 2023-01-25 — End: 2023-01-25
  Administered 2023-01-25: 80 mg via INTRAMUSCULAR

## 2023-01-25 MED ORDER — KETOROLAC TROMETHAMINE 60 MG/2ML IM SOLN
60.0000 mg | Freq: Once | INTRAMUSCULAR | Status: AC
Start: 2023-01-25 — End: 2023-01-25
  Administered 2023-01-25: 60 mg via INTRAMUSCULAR

## 2023-01-25 MED ORDER — DOXYCYCLINE HYCLATE 100 MG PO TABS
100.0000 mg | ORAL_TABLET | Freq: Two times a day (BID) | ORAL | 0 refills | Status: DC
Start: 2023-01-25 — End: 2023-03-01

## 2023-01-25 NOTE — Patient Instructions (Signed)
Peripheral Edema  Peripheral edema is swelling that is caused by a buildup of fluid. Peripheral edema most often affects the lower legs, ankles, and feet. It can also develop in the arms, hands, and face. The area of the body that has peripheral edema will look swollen. It may also feel heavy or warm. Your clothes may start to feel tight. Pressing on the area may make a temporary dent in your skin (pitting edema). You may not be able to move your swollen arm or leg as much as usual. There are many causes of peripheral edema. It can happen because of a complication of other conditions such as heart failure, kidney disease, or a problem with your circulation. It also can be a side effect of certain medicines or happen because of an infection. It often happens to women during pregnancy. Sometimes, the cause is not known. Follow these instructions at home: Managing pain, stiffness, and swelling  Raise (elevate) your legs while you are sitting or lying down. Move around often to prevent stiffness and to reduce swelling. Do not sit or stand for long periods of time. Do not wear tight clothing. Do not wear garters on your upper legs. Exercise your legs to get your circulation going. This helps to move the fluid back into your blood vessels, and it may help the swelling go down. Wear compression stockings as told by your health care provider. These stockings help to prevent blood clots and reduce swelling in your legs. It is important that these are the correct size. These stockings should be prescribed by your doctor to prevent possible injuries. If elastic bandages or wraps are recommended, use them as told by your health care provider. Medicines Take over-the-counter and prescription medicines only as told by your health care provider. Your health care provider may prescribe medicine to help your body get rid of excess water (diuretic). Take this medicine if you are told to take it. General  instructions Eat a low-salt (low-sodium) diet as told by your health care provider. Sometimes, eating less salt may reduce swelling. Pay attention to any changes in your symptoms. Moisturize your skin daily to help prevent skin from cracking and draining. Keep all follow-up visits. This is important. Contact a health care provider if: You have a fever. You have swelling in only one leg. You have increased swelling, redness, or pain in one or both of your legs. You have drainage or sores at the area where you have edema. Get help right away if: You have edema that starts suddenly or is getting worse, especially if you are pregnant or have a medical condition. You develop shortness of breath, especially when you are lying down. You have pain in your chest or abdomen. You feel weak. You feel like you will faint. These symptoms may be an emergency. Get help right away. Call 911. Do not wait to see if the symptoms will go away. Do not drive yourself to the hospital. Summary Peripheral edema is swelling that is caused by a buildup of fluid. Peripheral edema most often affects the lower legs, ankles, and feet. Move around often to prevent stiffness and to reduce swelling. Do not sit or stand for long periods of time. Pay attention to any changes in your symptoms. Contact a health care provider if you have edema that starts suddenly or is getting worse, especially if you are pregnant or have a medical condition. Get help right away if you develop shortness of breath, especially when lying down.   This information is not intended to replace advice given to you by your health care provider. Make sure you discuss any questions you have with your health care provider. Document Revised: 02/16/2021 Document Reviewed: 02/16/2021 Elsevier Patient Education  2024 Elsevier Inc.  

## 2023-01-25 NOTE — Progress Notes (Signed)
Subjective:    Patient ID: Eileen Arnold, female    DOB: Oct 13, 1960, 62 y.o.   MRN: [id number]  Chief Complaint  Patient presents with   Cellulitis   Pt presents to the office for weeping edema of left lower leg. Reports she fell in March and since then has had mild swelling and redness. She noticed some fluid coming out and went to the Urgent Care on 01/23/23 and they wrapped her leg.   Since then she has not noticed any weeping edema. Denies any pain at this time.   She is complaining of right shoulder and bicep pain. 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 [place] is [place] the beach in [place] few days and requesting steroid shot.  Cough This is a new problem. The current episode started 1 to 4 weeks ago. The problem occurs every few minutes. The cough is Productive of sputum. Pertinent negatives include no chills, ear congestion, ear pain, fever, headaches, myalgias, shortness of breath or wheezing. She has tried rest for the symptoms. The treatment provided mild relief.     Review of Systems  Constitutional:  Negative for chills and fever.  HENT:  Negative for ear pain.   Respiratory:  Positive for cough. Negative for shortness of breath and wheezing.   Musculoskeletal:  Negative for myalgias.  Neurological:  Negative for headaches.  All other systems reviewed and are negative.      Objective:   Physical Exam Vitals reviewed.  Constitutional:      General: She is not in acute distress.    Appearance: She is well-developed. She is obese.  HENT:     Head: Normocephalic and atraumatic.  Eyes:     Pupils: Pupils are equal, round, and reactive to light.  Neck:     Thyroid: No thyromegaly.  Cardiovascular:     Rate and Rhythm: Normal rate and regular rhythm.     Heart sounds: Normal heart sounds. No murmur heard. Pulmonary:     Effort: Pulmonary effort is normal. No respiratory distress.     Breath sounds: Normal breath sounds. No wheezing.  Abdominal:      General: Bowel sounds are normal. There is no distension.     Palpations: Abdomen is soft.     Tenderness: There is no abdominal tenderness.  Musculoskeletal:        General: No tenderness. Normal range of motion.     Cervical back: Normal range of motion and neck supple.     Left lower leg: Edema (trace swelling, mild tenderness) present.     Comments: Pain in right shoulder with abduction, full ROM  Skin:    General: Skin is warm and dry.  Neurological:     Mental Status: She is alert and oriented to person, place, and time.     Cranial Nerves: No cranial nerve deficit.     Deep Tendon Reflexes: Reflexes are normal and symmetric.  Psychiatric:        Behavior: Behavior normal.        Thought Content: Thought content normal.        Judgment: Judgment normal.       BP 123/70   Pulse 91   Temp 97.7 F (36.5 C)   Ht 5\' 3"  (1.6 m)   Wt 286 lb (129.7 kg)   SpO2 94%   BMI 50.66 kg/m      Assessment & Plan:  Eileen Arnold comes in today with chief complaint of  Cellulitis   Diagnosis and orders addressed:  1. Peripheral edema Low salt diet  Keep elevate Wear compression hose  - Compression stockings  2. Acute pain of right shoulder Continue with Ortho  Depo-medrol and Toradol given today ROM exercises  - methylPREDNISolone acetate (DEPO-MEDROL) injection 80 mg - ketorolac (TORADOL) injection 60 mg  3. Strain of right biceps, subsequent encounter - methylPREDNISolone acetate (DEPO-MEDROL) injection 80 mg - ketorolac (TORADOL) injection 60 mg  4. Acute bacterial bronchitis Rest - Take meds as prescribed - Use a cool mist humidifier  -Use saline nose sprays frequently -Force fluids -For any cough or congestion  Use plain Mucinex- regular strength or max strength is fine -For fever or aces or pains- take tylenol or ibuprofen. -Throat lozenges if helps  Will give doxycyline if symptoms worsen while out of town  - methylPREDNISolone acetate (DEPO-MEDROL)  injection 80 mg - doxycycline (VIBRA-TABS) 100 MG tablet; Take 1 tablet (100 mg total) by mouth 2 (two) times daily.  Dispense: 20 tablet; Refill: 0   Follow up if symptoms worsen or do not improve    Jannifer Rodney, FNP

## 2023-02-16 ENCOUNTER — Other Ambulatory Visit: Payer: Self-pay | Admitting: Nurse Practitioner

## 2023-02-16 DIAGNOSIS — N3281 Overactive bladder: Secondary | ICD-10-CM

## 2023-02-23 ENCOUNTER — Other Ambulatory Visit: Payer: Self-pay | Admitting: Orthopaedic Surgery

## 2023-02-23 DIAGNOSIS — Z01818 Encounter for other preprocedural examination: Secondary | ICD-10-CM

## 2023-02-24 ENCOUNTER — Encounter: Payer: Self-pay | Admitting: Orthopaedic Surgery

## 2023-03-01 ENCOUNTER — Encounter: Payer: Self-pay | Admitting: Nurse Practitioner

## 2023-03-01 ENCOUNTER — Ambulatory Visit: Payer: 59 | Admitting: Nurse Practitioner

## 2023-03-01 ENCOUNTER — Ambulatory Visit (INDEPENDENT_AMBULATORY_CARE_PROVIDER_SITE_OTHER): Payer: 59

## 2023-03-01 VITALS — BP 144/68 | HR 112 | Temp 97.7°F | Resp 20 | Ht 63.0 in | Wt 287.0 lb

## 2023-03-01 DIAGNOSIS — N3281 Overactive bladder: Secondary | ICD-10-CM

## 2023-03-01 DIAGNOSIS — Z01818 Encounter for other preprocedural examination: Secondary | ICD-10-CM

## 2023-03-01 DIAGNOSIS — M159 Polyosteoarthritis, unspecified: Secondary | ICD-10-CM | POA: Diagnosis not present

## 2023-03-01 MED ORDER — CELECOXIB 200 MG PO CAPS
200.0000 mg | ORAL_CAPSULE | Freq: Two times a day (BID) | ORAL | 0 refills | Status: DC
Start: 2023-03-01 — End: 2023-03-24

## 2023-03-01 MED ORDER — SOLIFENACIN SUCCINATE 5 MG PO TABS
5.0000 mg | ORAL_TABLET | Freq: Every day | ORAL | 0 refills | Status: DC
Start: 2023-03-01 — End: 2023-04-13

## 2023-03-01 NOTE — Progress Notes (Signed)
Subjective:    Patient ID: Eileen Arnold, female    DOB: 1960/09/28, 62 y.o.   MRN: [id number]  Chief Complaint: Pre-op Exam   HPI  Patient is getting ready to be scheduled for shoulder surgery once she is medically cleared for surgery, she is doing well today with no complaints.  Patient Active Problem List   Diagnosis Date Noted   Rectal bleeding 05/02/2018   History of colonic polyps 05/02/2018   Osteoarthritis 10/25/2016   Overactive bladder 10/25/2016   Morbid obesity with BMI of 45.0-49.9, adult (HCC) 10/25/2016   Family hx of colon cancer 10/29/2015       Review of Systems  Constitutional:  Negative for diaphoresis.  Eyes:  Negative for pain.  Respiratory:  Negative for shortness of breath.   Cardiovascular:  Negative for chest pain, palpitations and leg swelling.  Gastrointestinal:  Negative for abdominal pain.  Endocrine: Negative for polydipsia.  Skin:  Negative for rash.  Neurological:  Negative for dizziness, weakness and headaches.  Hematological:  Does not bruise/bleed easily.  All other systems reviewed and are negative.      Objective:   Physical Exam Vitals and nursing note reviewed.  Constitutional:      General: She is not in acute distress.    Appearance: Normal appearance. She is well-developed.  HENT:     Head: Normocephalic.     Right Ear: Tympanic membrane normal.     Left Ear: Tympanic membrane normal.     Nose: Nose normal.     Mouth/Throat:     Mouth: Mucous membranes are moist.  Eyes:     Pupils: Pupils are equal, round, and reactive to light.  Neck:     Vascular: No carotid bruit or JVD.  Cardiovascular:     Rate and Rhythm: Normal rate and regular rhythm.     Heart sounds: Normal heart sounds.  Pulmonary:     Effort: Pulmonary effort is normal. No respiratory distress.     Breath sounds: Normal breath sounds. No wheezing or rales.  Chest:     Chest wall: No tenderness.  Abdominal:     General: Bowel sounds are normal.  There is no distension or abdominal bruit.     Palpations: Abdomen is soft. There is no hepatomegaly, splenomegaly, mass or pulsatile mass.     Tenderness: There is no abdominal tenderness.  Musculoskeletal:        General: Normal range of motion.     Cervical back: Normal range of motion and neck supple.     Comments: Right arm is in a sling  Lymphadenopathy:     Cervical: No cervical adenopathy.  Skin:    General: Skin is warm and dry.  Neurological:     Mental Status: She is alert and oriented to person, place, and time.     Deep Tendon Reflexes: Reflexes are normal and symmetric.  Psychiatric:        Behavior: Behavior normal.        Thought Content: Thought content normal.        Judgment: Judgment normal.     BP (!) 144/68   Pulse (!) 112   Temp 97.7 F (36.5 C) (Temporal)   Resp 20   Ht 5\' 3"  (1.6 m)   Wt 287 lb (130.2 kg)   SpO2 95%   BMI 50.84 kg/m   EKG- NSR-Mary-Margaret Linc Renne, FNP  Chest xray- no acute or chronic findings      Assessment & Plan:  Eileen Arnold in today with chief complaint of Pre-op Exam   1. Preoperative clearance Cleared for surgery - EKG 12-Lead - DG Chest 1 View  2. Overactive bladder Meds refilled - solifenacin (VESICARE) 5 MG tablet; Take 1 tablet (5 mg total) by mouth daily. **NEEDS TO BE SEEN BEFORE NEXT REFILL**  Dispense: 30 tablet; Refill: 0  3. Osteoarthritis of multiple joints, unspecified osteoarthritis type Meds refilled - celecoxib (CELEBREX) 200 MG capsule; Take 1 capsule (200 mg total) by mouth 2 (two) times daily. (NEEDS TO BE SEEN BEFORE NEXT REFILL)  Dispense: 60 capsule; Refill: 0    The above assessment and management plan was discussed with the patient. The patient verbalized understanding of and has agreed to the management plan. Patient is aware to call the clinic if symptoms persist or worsen. Patient is aware when to return to the clinic for a follow-up visit. Patient educated on when it is  appropriate to go to the emergency department.   Mary-Margaret Daphine Deutscher, FNP

## 2023-03-03 ENCOUNTER — Ambulatory Visit
Admission: RE | Admit: 2023-03-03 | Discharge: 2023-03-03 | Disposition: A | Discharge status: Home / Self Care | Payer: 59 | Source: Ambulatory Visit | Attending: Orthopaedic Surgery | Admitting: Orthopaedic Surgery

## 2023-03-03 DIAGNOSIS — Z01818 Encounter for other preprocedural examination: Secondary | ICD-10-CM

## 2023-03-16 ENCOUNTER — Encounter (HOSPITAL_BASED_OUTPATIENT_CLINIC_OR_DEPARTMENT_OTHER): Payer: Self-pay | Admitting: Orthopaedic Surgery

## 2023-03-16 ENCOUNTER — Other Ambulatory Visit: Payer: Self-pay

## 2023-03-21 ENCOUNTER — Encounter (HOSPITAL_BASED_OUTPATIENT_CLINIC_OR_DEPARTMENT_OTHER)
Admission: RE | Admit: 2023-03-21 | Discharge: 2023-03-21 | Disposition: A | Discharge status: Home / Self Care | Payer: 59 | Source: Ambulatory Visit | Attending: Orthopaedic Surgery | Admitting: Orthopaedic Surgery

## 2023-03-21 DIAGNOSIS — Z01818 Encounter for other preprocedural examination: Secondary | ICD-10-CM | POA: Diagnosis present

## 2023-03-21 LAB — SURGICAL PCR SCREEN
MRSA, PCR: NEGATIVE
Staphylococcus aureus: NEGATIVE

## 2023-03-21 NOTE — Progress Notes (Signed)
Surgical soap given with instructions, pt verbalized understanding.  Benzoyl peroxide gel given with written instructions, pt verbalized understanding.

## 2023-03-21 NOTE — H&P (Signed)
PREOPERATIVE H&P  Chief Complaint: right shoulder OA  HPI: Eileen Arnold is a 62 y.o. female who is scheduled for Procedure(s): REVERSE SHOULDER ARTHROPLASTY.    62 year old female with a BMI of 51 who has had right shoulder pain for some time.  She has tried and failed nonoperative measures at this point including physical therapy for more than three months and injections.  She has had minimal relief with those.    Symptoms are rated as moderate to severe, and have been worsening.  This is significantly impairing activities of daily living.    Please see clinic note for further details on this patient's care.    She has elected for surgical management.   Past Medical History:  Diagnosis Date   Arthritis    Family hx of colon cancer    Localized swelling of left lower leg    tries to keep elevated   OAB (overactive bladder)    Past Surgical History:  Procedure Laterality Date   ABDOMINAL HYSTERECTOMY     APPENDECTOMY     BREAST REDUCTION SURGERY Bilateral    BUNIONECTOMY Right    CHOLECYSTECTOMY     CHOLECYSTECTOMY     COLONOSCOPY N/A 12/13/2014   Procedure: COLONOSCOPY;  Surgeon: Malissa Hippo, MD;  Location: [facility];  Service: Endoscopy;  Laterality: N/A;  830 - moved to 9:15 - Ann to notify pt   COLONOSCOPY N/A 05/22/2018   Procedure: COLONOSCOPY;  Surgeon: Malissa Hippo, MD;  Location: [facility];  Service: Endoscopy;  Laterality: N/A;  1:55   FOOT SURGERY     OOPHORECTOMY Right 2011   Social History   Socioeconomic History   Marital status: Married    Spouse name: Not on file   Number of children: 2   Years of education: Not on file   Highest education level: Professional school degree (e.g., MD, DDS, DVM, JD)  Occupational History   Not on file  Tobacco Use   Smoking status: Former    Current packs/day: 0.00    Average packs/day: 1 pack/day for 38.0 years (38.0 ttl pk-yrs)    Types: Cigarettes    Start date: 07/14/1976    Quit date:  07/14/2014    Years since quitting: 8.6   Smokeless tobacco: Never  Substance and Sexual Activity   Alcohol use: Yes    Comment: occasionally   Drug use: No   Sexual activity: Yes    Birth control/protection: Surgical    Comment: Hyst  Other Topics Concern   Not on file  Social History Narrative   Not on file   Social Determinants of Health   Financial Resource Strain: Low Risk  (01/24/2023)   Overall Financial Resource Strain (CARDIA)    Difficulty of Paying Living Expenses: Not hard at all  Food Insecurity: No Food Insecurity (01/24/2023)   Hunger Vital Sign    Worried About Running Out of Food in the Last Year: Never true    Ran Out of Food in the Last Year: Never true  Transportation Needs: No Transportation Needs (01/24/2023)   PRAPARE - Administrator, Civil Service (Medical): No    Lack of Transportation (Non-Medical): No  Physical Activity: Unknown (01/24/2023)   Exercise Vital Sign    Days of Exercise per Week: 0 days    Minutes of Exercise per Session: Not on file  Stress: No Stress Concern Present (01/24/2023)   [employer] of Occupational Health - Occupational Stress Questionnaire  Feeling of Stress : Not at all  Social Connections: Socially Integrated (01/24/2023)   Social Connection and Isolation Panel [NHANES]    Frequency of Communication with Friends and Family: More than three times a week    Frequency of Social Gatherings with Friends and Family: More than three times a week    Attends Religious Services: More than 4 times per year    Active Member of [employer] or Organizations: Yes    Attends Engineer, structural: More than 4 times per year    Marital Status: Married   Family History  Problem Relation Age of Onset   Alzheimer's disease Mother    Stroke Mother    Heart disease Father    Nephrolithiasis Father    Cancer Sister    Hypertension Sister    No Known Allergies Prior to Admission medications   Medication Sig Start  Date End Date Taking? Authorizing Provider  acetaminophen (TYLENOL) 500 MG tablet Take 1,000 mg by mouth 3 (three) times daily as needed for moderate pain or headache.   Yes [provider]  celecoxib (CELEBREX) 200 MG capsule Take 1 capsule (200 mg total) by mouth 2 (two) times daily. (NEEDS TO BE SEEN BEFORE NEXT REFILL) 03/01/23  Yes Daphine Deutscher, Mary-Margaret, FNP  cholecalciferol (VITAMIN D3) 25 MCG (1000 UT) tablet Take 1,000 Units by mouth daily.   Yes [provider]  Multiple Vitamin (MULTIVITAMIN WITH MINERALS) TABS tablet Take 1 tablet by mouth daily.   Yes [provider]  Omega-3 Fatty Acids (FISH OIL) 1000 MG CAPS Take 1,000 mg by mouth daily.   Yes [provider]  solifenacin (VESICARE) 5 MG tablet Take 1 tablet (5 mg total) by mouth daily. **NEEDS TO BE SEEN BEFORE NEXT REFILL** 03/01/23  Yes Daphine Deutscher, Mary-Margaret, FNP    ROS: All other systems have been reviewed and were otherwise negative with the exception of those mentioned in the HPI and as above.  Physical Exam: General: Alert, no acute distress Cardiovascular: No pedal edema Respiratory: No cyanosis, no use of accessory musculature GI: No organomegaly, abdomen is soft and non-tender Skin: No lesions in the area of chief complaint Neurologic: Sensation intact distally Psychiatric: Patient is competent for consent with normal mood and affect Lymphatic: No axillary or cervical lymphadenopathy  MUSCULOSKELETAL:  On exam, range of motion of the right shoulder to about 90 degrees; passive to 160; cuff strength is weak throughout.    Imaging: MRI demonstrates a massive retracted tear of the cuff with a delaminated layer and retraction past the mid humeral head  BMI: Estimated body mass index is 50.84 kg/m as calculated from the following:   Height as of this encounter: 5\' 3"  (1.6 m).   Weight as of this encounter: 130.2 kg.  Lab Results  Component Value Date   ALBUMIN 4.2 03/16/2022    Diabetes: Patient does not have a diagnosis of diabetes.     Smoking Status:       Assessment: right shoulder OA  Plan: Plan for Procedure(s): REVERSE SHOULDER ARTHROPLASTY   The risks benefits and alternatives were discussed with the patient including but not limited to the risks of nonoperative treatment, versus surgical intervention including infection, bleeding, nerve injury,  blood clots, cardiopulmonary complications, morbidity, mortality, among others, and they were willing to proceed.   We additionally specifically discussed risks of axillary nerve injury, infection, periprosthetic fracture, continued pain and longevity of implants prior to beginning procedure.    Patient will be closely monitored in  PACU for medical stabilization and pain control. If found stable in PACU, patient may be discharged home with outpatient follow-up. If any concerns regarding patient's stabilization patient will be admitted for observation after surgery. The patient is planning to be discharged home with outpatient PT.   The patient acknowledged the explanation, agreed to proceed with the plan and consent was signed.   Operative Plan: Right reverse total shoulder arthroplasty  Discharge Medications: Oxycodone, Tylenol, Celebrex, Zofran DVT Prophylaxis: n/a Physical Therapy: [facility] 10/07 Special Discharge needs: Sling, Ice man    Corinna Capra, PA-C  03/21/2023 11:14 AM

## 2023-03-22 NOTE — Discharge Instructions (Signed)
Ramond Marrow MD, MPH Alfonse Alpers, PA-C [facility] [address], [address] [phone] (tel)   [phone] (fax)   POST-OPERATIVE INSTRUCTIONS - TOTAL SHOULDER REPLACEMENT    WOUND CARE You may leave the operative dressing in place until your follow-up appointment. KEEP THE INCISIONS CLEAN AND DRY. There may be a small amount of fluid/bleeding leaking at the surgical site. This is normal after surgery.  If it fills with liquid or blood please call us immediately to change it for you. Use the provided ice machine or Ice packs as often as possible for the first 3-4 days, then as needed for pain relief.   Keep a layer of cloth or a shirt between your skin and the cooling unit to prevent frost bite as it can get very cold.  SHOWERING: - You may shower on Post-Op Day #2.  - The dressing is water resistant but do not scrub it as it may start to peel up.   - You may remove the sling for showering - Gently pat the area dry.  - Do not soak the shoulder in water.  - Do not go swimming in the pool or ocean until your incision has completely healed (about 4-6 weeks after surgery) - KEEP THE INCISIONS CLEAN AND DRY.  EXERCISES Wear the sling at all times  You may remove the sling for showering, but keep the arm across the chest or in a secondary sling.    Accidental/Purposeful External Rotation and shoulder flexion (reaching behind you) is to be avoided at all costs for the first month. It is ok to come out of your sling if your are sitting and have assistance for eating.   Do not lift anything heavier than 1 pound until we discuss it further in clinic.  It is normal for your fingers/hand to become more swollen after surgery and discolored from bruising.   This will resolve over the first few weeks usually after surgery. Please continue to ambulate and do not stay sitting or lying for too long.  Perform foot and wrist pumps to assist in circulation.  PHYSICAL  THERAPY - You will begin physical therapy soon after surgery (unless otherwise specified) - Please call to set up an appointment, if you do not already have one  - Let our office if there are any issues with scheduling your therapy  - You have a physical therapy appointment scheduled at [facility] (across the hall from our office) on 10/7   REGIONAL ANESTHESIA (NERVE BLOCKS) The anesthesia team may have performed a nerve block for you this is a great tool used to minimize pain.   The block may start wearing off overnight (between 8-24 hours postop) When the block wears off, your pain may go from nearly zero to the pain you would have had postop without the block. This is an abrupt transition but nothing dangerous is happening.   This can be a challenging period but utilize your as needed pain medications to try and manage this period. We suggest you use the pain medication the first night prior to going to bed, to ease this transition.  You may take an extra dose of narcotic when this happens if needed   POST-OP MEDICATIONS- Multimodal approach to pain control In general your pain will be controlled with a combination of substances.  Prescriptions unless otherwise discussed are electronically sent to your pharmacy.  This is a carefully made plan we use to minimize narcotic use.  Meloxicam - Anti-inflammatory medication taken on a scheduled basis Acetaminophen - Non-narcotic pain medicine taken on a scheduled basis  Oxycodone - This is a strong narcotic, to be used only on an "as needed" basis for SEVERE pain. Aspirin 81mg  - This medicine is used to minimize the risk of blood clots after surgery. Omeprazole - daily medicine to protect your stomach while taking anti-inflammatories.  Zofran -  take as needed for nausea   FOLLOW-UP If you develop a Fever (>101.5), Redness or Drainage from the surgical incision site, please call our office to arrange for an evaluation. Please call the office  to schedule a follow-up appointment for a wound check, 7-10 days post-operatively.  IF YOU HAVE ANY QUESTIONS, PLEASE FEEL FREE TO CALL OUR OFFICE.  HELPFUL INFORMATION  Your arm will be in a sling following surgery. You will be in this sling for the next 4 weeks.   You may be more comfortable sleeping in a semi-seated position the first few nights following surgery.  Keep a pillow propped under the elbow and forearm for comfort.  If you have a recliner type of chair it might be beneficial.  If not that is fine too, but it would be helpful to sleep propped up with pillows behind your operated shoulder as well under your elbow and forearm.  This will reduce pulling on the suture lines.  When dressing, put your operative arm in the sleeve first.  When getting undressed, take your operative arm out last.  Loose fitting, button-down shirts are recommended.  In most states it is against the law to drive while your arm is in a sling. And certainly against the law to drive while taking narcotics.  You may return to work/school in the next couple of days when you feel up to it. Desk work and typing in the sling is fine.  We suggest you use the pain medication the first night prior to going to bed, in order to ease any pain when the anesthesia wears off. You should avoid taking pain medications on an empty stomach as it will make you nauseous.  You should wean off your narcotic medicines as soon as you are able.     Most patients will be off narcotics before their first postop appointment.   Do not drink alcoholic beverages or take illicit drugs when taking pain medications.  Pain medication may make you constipated.  Below are a few solutions to try in this order: Decrease the amount of pain medication if you aren't having pain. Drink lots of decaffeinated fluids. Drink prune juice and/or each dried prunes  If the first 3 don't work start with additional solutions Take Colace - an  over-the-counter stool softener Take Senokot - an over-the-counter laxative Take Miralax - a stronger over-the-counter laxative   Dental Antibiotics:  In most cases prophylactic antibiotics for Dental procdeures after total joint surgery are not necessary.  Exceptions are as follows:  1. History of prior total joint infection  2. Severely immunocompromised (Organ Transplant, cancer chemotherapy, Rheumatoid biologic meds such as Humira)  3. Poorly controlled diabetes (A1C &gt; 8.0, blood glucose over 200)  If you have one of these conditions, contact your surgeon for an antibiotic prescription, prior to your dental procedure.   For more information including helpful videos and documents visit our website:   [email or web address]  May have tylenol at 12:46pm if needed   Post Anesthesia Home Care Instructions  Activity: Get plenty of rest for the remainder of the  day. A responsible individual must stay with you for 24 hours following the procedure.  For the next 24 hours, DO NOT: -Drive a car -Advertising copywriter -Drink alcoholic beverages -Take any medication unless instructed by your physician -Make any legal decisions or sign important papers.  Meals: Start with liquid foods such as gelatin or soup. Progress to regular foods as tolerated. Avoid greasy, spicy, heavy foods. If nausea and/or vomiting occur, drink only clear liquids until the nausea and/or vomiting subsides. Call your physician if vomiting continues.  Special Instructions/Symptoms: Your throat may feel dry or sore from the anesthesia or the breathing tube placed in your throat during surgery. If this causes discomfort, gargle with warm salt water. The discomfort should disappear within 24 hours.  If you had a scopolamine patch placed behind your ear for the management of post- operative nausea and/or vomiting:  1. The medication in the patch is effective for 72 hours, after  which it should be removed.  Wrap patch in a tissue and discard in the trash. Wash hands thoroughly with soap and water. 2. You may remove the patch earlier than 72 hours if you experience unpleasant side effects which may include dry mouth, dizziness or visual disturbances. 3. Avoid touching the patch. Wash your hands with soap and water after contact with the patch.

## 2023-03-24 ENCOUNTER — Ambulatory Visit (HOSPITAL_BASED_OUTPATIENT_CLINIC_OR_DEPARTMENT_OTHER): Payer: 59 | Admitting: Certified Registered Nurse Anesthetist

## 2023-03-24 ENCOUNTER — Ambulatory Visit (HOSPITAL_BASED_OUTPATIENT_CLINIC_OR_DEPARTMENT_OTHER)
Admission: RE | Admit: 2023-03-24 | Discharge: 2023-03-24 | Disposition: A | Discharge status: Home / Self Care | Payer: 59 | Attending: Orthopaedic Surgery | Admitting: Orthopaedic Surgery

## 2023-03-24 ENCOUNTER — Other Ambulatory Visit: Payer: Self-pay

## 2023-03-24 ENCOUNTER — Encounter (HOSPITAL_BASED_OUTPATIENT_CLINIC_OR_DEPARTMENT_OTHER): Payer: Self-pay | Admitting: Orthopaedic Surgery

## 2023-03-24 ENCOUNTER — Ambulatory Visit (HOSPITAL_COMMUNITY): Payer: 59

## 2023-03-24 ENCOUNTER — Encounter (HOSPITAL_BASED_OUTPATIENT_CLINIC_OR_DEPARTMENT_OTHER)
Admission: RE | Disposition: A | Discharge status: Home / Self Care | Payer: Self-pay | Source: Home / Self Care | Attending: Orthopaedic Surgery

## 2023-03-24 DIAGNOSIS — M19011 Primary osteoarthritis, right shoulder: Secondary | ICD-10-CM | POA: Diagnosis not present

## 2023-03-24 DIAGNOSIS — M75101 Unspecified rotator cuff tear or rupture of right shoulder, not specified as traumatic: Secondary | ICD-10-CM | POA: Diagnosis present

## 2023-03-24 DIAGNOSIS — Z6841 Body Mass Index (BMI) 40.0 and over, adult: Secondary | ICD-10-CM | POA: Insufficient documentation

## 2023-03-24 DIAGNOSIS — Z87891 Personal history of nicotine dependence: Secondary | ICD-10-CM | POA: Insufficient documentation

## 2023-03-24 DIAGNOSIS — Z01818 Encounter for other preprocedural examination: Secondary | ICD-10-CM

## 2023-03-24 DIAGNOSIS — N3281 Overactive bladder: Secondary | ICD-10-CM | POA: Diagnosis not present

## 2023-03-24 HISTORY — PX: REVERSE SHOULDER ARTHROPLASTY: SHX5054

## 2023-03-24 HISTORY — DX: Overactive bladder: N32.81

## 2023-03-24 HISTORY — DX: Localized swelling, mass and lump, left lower limb: R22.42

## 2023-03-24 SURGERY — ARTHROPLASTY, SHOULDER, TOTAL, REVERSE
Anesthesia: Regional | Site: Shoulder | Laterality: Right

## 2023-03-24 MED ORDER — SUCCINYLCHOLINE CHLORIDE 200 MG/10ML IV SOSY
PREFILLED_SYRINGE | INTRAVENOUS | Status: DC | PRN
Start: 2023-03-24 — End: 2023-03-24
  Administered 2023-03-24: 180 mg via INTRAVENOUS

## 2023-03-24 MED ORDER — CEFAZOLIN IN SODIUM CHLORIDE 3-0.9 GM/100ML-% IV SOLN
3.0000 g | INTRAVENOUS | Status: AC
  Administered 2023-03-24: 3 g via INTRAVENOUS

## 2023-03-24 MED ORDER — FENTANYL CITRATE (PF) 100 MCG/2ML IJ SOLN
INTRAMUSCULAR | Status: AC
  Filled 2023-03-24: qty 2

## 2023-03-24 MED ORDER — EPHEDRINE SULFATE-NACL 50-0.9 MG/10ML-% IV SOSY
PREFILLED_SYRINGE | INTRAVENOUS | Status: DC | PRN
Start: 2023-03-24 — End: 2023-03-24
  Administered 2023-03-24 (×2): 5 mg via INTRAVENOUS

## 2023-03-24 MED ORDER — LACTATED RINGERS IV SOLN: INTRAVENOUS | Status: DC

## 2023-03-24 MED ORDER — ACETAMINOPHEN 500 MG PO TABS
ORAL_TABLET | ORAL | Status: AC
  Filled 2023-03-24: qty 2

## 2023-03-24 MED ORDER — DEXAMETHASONE SODIUM PHOSPHATE 10 MG/ML IJ SOLN
INTRAMUSCULAR | Status: DC | PRN
  Administered 2023-03-24: 10 mg via INTRAVENOUS

## 2023-03-24 MED ORDER — PHENYLEPHRINE HCL (PRESSORS) 10 MG/ML IV SOLN
INTRAVENOUS | Status: AC
  Filled 2023-03-24: qty 1

## 2023-03-24 MED ORDER — PHENYLEPHRINE 80 MCG/ML (10ML) SYRINGE FOR IV PUSH (FOR BLOOD PRESSURE SUPPORT)
PREFILLED_SYRINGE | INTRAVENOUS | Status: AC
  Filled 2023-03-24: qty 10

## 2023-03-24 MED ORDER — SUGAMMADEX SODIUM 200 MG/2ML IV SOLN
INTRAVENOUS | Status: DC | PRN
  Administered 2023-03-24: 200 mg via INTRAVENOUS

## 2023-03-24 MED ORDER — TRANEXAMIC ACID-NACL 1000-0.7 MG/100ML-% IV SOLN
1000.0000 mg | INTRAVENOUS | Status: AC
  Administered 2023-03-24: 1000 mg via INTRAVENOUS

## 2023-03-24 MED ORDER — MIDAZOLAM HCL 2 MG/2ML IJ SOLN
INTRAMUSCULAR | Status: AC
  Filled 2023-03-24: qty 2

## 2023-03-24 MED ORDER — ROCURONIUM BROMIDE 10 MG/ML (PF) SYRINGE
PREFILLED_SYRINGE | INTRAVENOUS | Status: DC | PRN
  Administered 2023-03-24: 20 mg via INTRAVENOUS

## 2023-03-24 MED ORDER — PANTOPRAZOLE SODIUM 20 MG PO TBEC: 20.0000 mg | DELAYED_RELEASE_TABLET | Freq: Every day | ORAL | 0 refills | Status: DC

## 2023-03-24 MED ORDER — BUPIVACAINE HCL (PF) 0.5 % IJ SOLN
INTRAMUSCULAR | Status: DC | PRN
Start: 2023-03-24 — End: 2023-03-24
  Administered 2023-03-24: 15 mL via PERINEURAL

## 2023-03-24 MED ORDER — ACETAMINOPHEN 500 MG PO TABS: 1000.0000 mg | ORAL_TABLET | Freq: Once | ORAL | Status: DC

## 2023-03-24 MED ORDER — TRANEXAMIC ACID-NACL 1000-0.7 MG/100ML-% IV SOLN
INTRAVENOUS | Status: AC
  Filled 2023-03-24: qty 100

## 2023-03-24 MED ORDER — PHENYLEPHRINE 80 MCG/ML (10ML) SYRINGE FOR IV PUSH (FOR BLOOD PRESSURE SUPPORT)
PREFILLED_SYRINGE | INTRAVENOUS | Status: DC | PRN
  Administered 2023-03-24 (×4): 160 ug via INTRAVENOUS

## 2023-03-24 MED ORDER — OXYCODONE HCL 5 MG PO TABS: 5.0000 mg | ORAL_TABLET | Freq: Four times a day (QID) | ORAL | 0 refills | Status: AC | PRN

## 2023-03-24 MED ORDER — ONDANSETRON HCL 4 MG PO TABS: 4.0000 mg | ORAL_TABLET | Freq: Three times a day (TID) | ORAL | 0 refills | Status: DC | PRN

## 2023-03-24 MED ORDER — VANCOMYCIN HCL 1000 MG IV SOLR
INTRAVENOUS | Status: AC
  Filled 2023-03-24: qty 20

## 2023-03-24 MED ORDER — PROPOFOL 10 MG/ML IV BOLUS
INTRAVENOUS | Status: DC | PRN
Start: 2023-03-24 — End: 2023-03-24
  Administered 2023-03-24: 150 mg via INTRAVENOUS

## 2023-03-24 MED ORDER — FENTANYL CITRATE (PF) 250 MCG/5ML IJ SOLN
INTRAMUSCULAR | Status: DC | PRN
  Administered 2023-03-24: 50 ug via INTRAVENOUS

## 2023-03-24 MED ORDER — ONDANSETRON HCL 4 MG/2ML IJ SOLN
INTRAMUSCULAR | Status: AC
  Filled 2023-03-24: qty 2

## 2023-03-24 MED ORDER — CEFAZOLIN IN SODIUM CHLORIDE 3-0.9 GM/100ML-% IV SOLN
INTRAVENOUS | Status: AC
  Filled 2023-03-24: qty 100

## 2023-03-24 MED ORDER — LIDOCAINE 2% (20 MG/ML) 5 ML SYRINGE
INTRAMUSCULAR | Status: DC | PRN
  Administered 2023-03-24: 40 mg via INTRAVENOUS

## 2023-03-24 MED ORDER — FENTANYL CITRATE (PF) 100 MCG/2ML IJ SOLN
100.0000 ug | Freq: Once | INTRAMUSCULAR | Status: AC
  Administered 2023-03-24: 50 ug via INTRAVENOUS

## 2023-03-24 MED ORDER — ACETAMINOPHEN 500 MG PO TABS
1000.0000 mg | ORAL_TABLET | Freq: Once | ORAL | Status: AC
  Administered 2023-03-24: 1000 mg via ORAL

## 2023-03-24 MED ORDER — DEXAMETHASONE SODIUM PHOSPHATE 10 MG/ML IJ SOLN
INTRAMUSCULAR | Status: AC
  Filled 2023-03-24: qty 1

## 2023-03-24 MED ORDER — LIDOCAINE 2% (20 MG/ML) 5 ML SYRINGE
INTRAMUSCULAR | Status: AC
  Filled 2023-03-24: qty 5

## 2023-03-24 MED ORDER — SODIUM CHLORIDE 0.9 % IR SOLN
Status: DC | PRN
Start: 2023-03-24 — End: 2023-03-24
  Administered 2023-03-24: 1000 mL

## 2023-03-24 MED ORDER — 0.9 % SODIUM CHLORIDE (POUR BTL) OPTIME
TOPICAL | Status: DC | PRN
  Administered 2023-03-24: 1000 mL

## 2023-03-24 MED ORDER — FENTANYL CITRATE (PF) 100 MCG/2ML IJ SOLN: 25.0000 ug | INTRAMUSCULAR | Status: DC | PRN

## 2023-03-24 MED ORDER — PROPOFOL 10 MG/ML IV BOLUS
INTRAVENOUS | Status: AC
  Filled 2023-03-24: qty 20

## 2023-03-24 MED ORDER — ONDANSETRON HCL 4 MG/2ML IJ SOLN
INTRAMUSCULAR | Status: DC | PRN
  Administered 2023-03-24: 4 mg via INTRAVENOUS

## 2023-03-24 MED ORDER — MIDAZOLAM HCL 2 MG/2ML IJ SOLN
2.0000 mg | Freq: Once | INTRAMUSCULAR | Status: AC
  Administered 2023-03-24: 1 mg via INTRAVENOUS

## 2023-03-24 MED ORDER — ASPIRIN 81 MG PO CHEW: 81.0000 mg | CHEWABLE_TABLET | Freq: Two times a day (BID) | ORAL | 0 refills | Status: DC

## 2023-03-24 MED ORDER — MELOXICAM 15 MG PO TABS: 15.0000 mg | ORAL_TABLET | Freq: Every day | ORAL | 0 refills | Status: AC

## 2023-03-24 MED ORDER — PROPOFOL 500 MG/50ML IV EMUL
INTRAVENOUS | Status: AC
  Filled 2023-03-24: qty 50

## 2023-03-24 MED ORDER — BUPIVACAINE HCL (PF) 0.25 % IJ SOLN
INTRAMUSCULAR | Status: AC
  Filled 2023-03-24: qty 30

## 2023-03-24 MED ORDER — GABAPENTIN 300 MG PO CAPS
300.0000 mg | ORAL_CAPSULE | Freq: Once | ORAL | Status: AC
  Administered 2023-03-24: 300 mg via ORAL

## 2023-03-24 MED ORDER — VANCOMYCIN HCL 1000 MG IV SOLR
INTRAVENOUS | Status: DC | PRN
  Administered 2023-03-24: 1000 mg via TOPICAL

## 2023-03-24 MED ORDER — BUPIVACAINE LIPOSOME 1.3 % IJ SUSP
INTRAMUSCULAR | Status: DC | PRN
  Administered 2023-03-24: 10 mL via PERINEURAL

## 2023-03-24 MED ORDER — ROCURONIUM BROMIDE 10 MG/ML (PF) SYRINGE
PREFILLED_SYRINGE | INTRAVENOUS | Status: AC
  Filled 2023-03-24: qty 10

## 2023-03-24 MED ORDER — GABAPENTIN 300 MG PO CAPS
ORAL_CAPSULE | ORAL | Status: AC
  Filled 2023-03-24: qty 1

## 2023-03-24 MED ORDER — ACETAMINOPHEN 500 MG PO TABS: 1000.0000 mg | ORAL_TABLET | Freq: Three times a day (TID) | ORAL | 0 refills | Status: AC

## 2023-03-24 SURGICAL SUPPLY — 67 items
AID PSTN UNV HD RSTRNT DISP (MISCELLANEOUS) ×1
APL PRP STRL LF DISP 70% ISPRP (MISCELLANEOUS) ×1
BASEPLATE GLENOID RSA 3X25 0D (Shoulder) IMPLANT
BIT DRILL 3.2 PERIPHERAL SCREW (BIT) IMPLANT
BLADE SAW SGTL 73X25 THK (BLADE) ×1 IMPLANT
BLADE SURG 10 STRL SS (BLADE) IMPLANT
BLADE SURG 15 STRL LF DISP TIS (BLADE) IMPLANT
BLADE SURG 15 STRL SS (BLADE)
BRUSH SCRUB EZ PLAIN DRY (MISCELLANEOUS) ×1 IMPLANT
BSPLAT GLND +3 25 (Shoulder) ×1 IMPLANT
CHLORAPREP W/TINT 26 (MISCELLANEOUS) ×1 IMPLANT
CLSR STERI-STRIP ANTIMIC 1/2X4 (GAUZE/BANDAGES/DRESSINGS) ×1 IMPLANT
COOLER ICEMAN CLASSIC (MISCELLANEOUS) ×1 IMPLANT
COVER BACK TABLE 60X90IN (DRAPES) ×1 IMPLANT
COVER MAYO STAND STRL (DRAPES) ×1 IMPLANT
CUP HUM RET INSRT SZ 1/2 36 +3 (Joint) IMPLANT
DRAPE IMP U-DRAPE 54X76 (DRAPES) IMPLANT
DRAPE INCISE IOBAN 66X45 STRL (DRAPES) ×1 IMPLANT
DRAPE POUCH INSTRU U-SHP 10X18 (DRAPES) ×1 IMPLANT
DRAPE U-SHAPE 76X120 STRL (DRAPES) ×2 IMPLANT
DRSG AQUACEL AG ADV 3.5X 6 (GAUZE/BANDAGES/DRESSINGS) ×1 IMPLANT
ELECT BLADE 4.0 EZ CLEAN MEGAD (MISCELLANEOUS) ×1
ELECT REM PT RETURN 9FT ADLT (ELECTROSURGICAL) ×1
ELECTRODE BLDE 4.0 EZ CLN MEGD (MISCELLANEOUS) ×1 IMPLANT
ELECTRODE REM PT RTRN 9FT ADLT (ELECTROSURGICAL) ×1 IMPLANT
FACESHIELD WRAPAROUND (MASK) ×3
FACESHIELD WRAPAROUND OR TEAM (MASK) ×2 IMPLANT
GAUZE XEROFORM 1X8 LF (GAUZE/BANDAGES/DRESSINGS) IMPLANT
GLOVE BIO SURGEON STRL SZ 6.5 (GLOVE) ×2 IMPLANT
GLOVE BIOGEL PI IND STRL 6.5 (GLOVE) ×1 IMPLANT
GLOVE BIOGEL PI IND STRL 8 (GLOVE) ×1 IMPLANT
GLOVE ECLIPSE 8.0 STRL XLNG CF (GLOVE) ×2 IMPLANT
GOWN STRL REUS W/ TWL LRG LVL3 (GOWN DISPOSABLE) ×2 IMPLANT
GOWN STRL REUS W/TWL LRG LVL3 (GOWN DISPOSABLE) ×2
GOWN STRL REUS W/TWL XL LVL3 (GOWN DISPOSABLE) ×1 IMPLANT
GUIDE PIN 3X75 SHOULDER (PIN) ×1
GUIDEWIRE GLENOID 2.5X220 (WIRE) IMPLANT
HANDPIECE INTERPULSE COAX TIP (DISPOSABLE) ×1
KIT SHOULDER STAB MARCO (KITS) ×1 IMPLANT
MANIFOLD NEPTUNE II (INSTRUMENTS) ×1 IMPLANT
PACK BASIN DAY SURGERY FS (CUSTOM PROCEDURE TRAY) ×1 IMPLANT
PACK SHOULDER (CUSTOM PROCEDURE TRAY) ×1 IMPLANT
PAD COLD SHLDR WRAP-ON (PAD) ×1 IMPLANT
PIN GUIDE 3X75 SHOULDER (PIN) IMPLANT
RESTRAINT HEAD UNIVERSAL NS (MISCELLANEOUS) ×1 IMPLANT
SCREW 5.5X22 (Screw) IMPLANT
SCREW 5.5X26 (Screw) IMPLANT
SCREW BONE 6.5X40 SM (Screw) IMPLANT
SET HNDPC FAN SPRY TIP SCT (DISPOSABLE) ×1 IMPLANT
SHEET MEDIUM DRAPE 40X70 STRL (DRAPES) ×1 IMPLANT
SLEEVE SCD COMPRESS KNEE MED (STOCKING) ×1 IMPLANT
SPHERE GLENOID LAT REV 36 (Joint) IMPLANT
SPIKE FLUID TRANSFER (MISCELLANEOUS) IMPLANT
SPONGE T-LAP 18X18 ~~LOC~~+RFID (SPONGE) ×1 IMPLANT
STEM HUMERAL PLUS SHORT SZ2+ (Orthopedic Implant) IMPLANT
SUT ETHIBOND 2 V 37 (SUTURE) ×1 IMPLANT
SUT ETHIBOND NAB CT1 #1 30IN (SUTURE) ×1 IMPLANT
SUT ETHILON 3 0 PS 1 (SUTURE) IMPLANT
SUT FIBERWIRE #5 38 CONV NDL (SUTURE) ×2
SUT MNCRL AB 4-0 PS2 18 (SUTURE) ×1 IMPLANT
SUT VIC AB 0 CT1 27 (SUTURE)
SUT VIC AB 0 CT1 27XBRD ANBCTR (SUTURE) IMPLANT
SUT VIC AB 3-0 SH 27 (SUTURE) ×1
SUT VIC AB 3-0 SH 27X BRD (SUTURE) ×1 IMPLANT
SUTURE FIBERWR #5 38 CONV NDL (SUTURE) ×2 IMPLANT
TOWEL GREEN STERILE FF (TOWEL DISPOSABLE) ×3 IMPLANT
TUBE SUCTION HIGH CAP CLEAR NV (SUCTIONS) ×1 IMPLANT

## 2023-03-24 NOTE — Anesthesia Procedure Notes (Signed)
Procedure Name: Intubation Date/Time: 03/24/2023 8:11 AM  Performed by: Demetrio Lapping, CRNAPre-anesthesia Checklist: Patient identified, Emergency Drugs available, Suction available and Patient being monitored Patient Re-evaluated:Patient Re-evaluated prior to induction Oxygen Delivery Method: Circle System Utilized Preoxygenation: Pre-oxygenation with 100% oxygen Induction Type: IV induction Ventilation: Mask ventilation without difficulty Laryngoscope Size: Mac and 3 Grade View: Grade I Tube type: Oral Tube size: 7.0 mm Number of attempts: 1 Airway Equipment and Method: Stylet and Oral airway Placement Confirmation: ETT inserted through vocal cords under direct vision, positive ETCO2 and breath sounds checked- equal and bilateral Secured at: 22 cm Tube secured with: Tape Dental Injury: Teeth and Oropharynx as per pre-operative assessment

## 2023-03-24 NOTE — Anesthesia Postprocedure Evaluation (Signed)
Anesthesia Post Note  Patient: Eileen Arnold  Procedure(s) Performed: REVERSE SHOULDER ARTHROPLASTY (Right: Shoulder)     Patient location during evaluation: PACU Anesthesia Type: Regional and General Level of consciousness: awake and alert Pain management: pain level controlled Vital Signs Assessment: post-procedure vital signs reviewed and stable Respiratory status: spontaneous breathing, nonlabored ventilation, respiratory function stable and patient connected to nasal cannula oxygen Cardiovascular status: blood pressure returned to baseline and stable Postop Assessment: no apparent nausea or vomiting Anesthetic complications: no  No notable events documented.  Last Vitals:  Vitals:   03/24/23 1010 03/24/23 1015  BP: (!) 156/90 (!) 154/92  Pulse:  78  Resp:  14  Temp:    SpO2: 94% 95%    Last Pain:  Vitals:   03/24/23 1010  TempSrc:   PainSc: 0-No pain                 Tenzin Edelman L Colleen Donahoe

## 2023-03-24 NOTE — Anesthesia Preprocedure Evaluation (Addendum)
Anesthesia Evaluation  Patient identified by MRN, date of birth, ID band Patient awake    Reviewed: Allergy & Precautions, NPO status , Patient's Chart, lab work & pertinent test results  Airway Mallampati: III  TM Distance: >3 FB Neck ROM: Full    Dental no notable dental hx. (+) Teeth Intact, Dental Advisory Given   Pulmonary former smoker   Pulmonary exam normal breath sounds clear to auscultation       Cardiovascular negative cardio ROS Normal cardiovascular exam Rhythm:Regular Rate:Normal     Neuro/Psych negative neurological ROS  negative psych ROS   GI/Hepatic negative GI ROS, Neg liver ROS,,,  Endo/Other    Morbid obesity (BMI 51)  Renal/GU negative Renal ROS  negative genitourinary   Musculoskeletal  (+) Arthritis ,    Abdominal   Peds  Hematology negative hematology ROS (+)   Anesthesia Other Findings   Reproductive/Obstetrics                             Anesthesia Physical Anesthesia Plan  ASA: 3  Anesthesia Plan: General and Regional   Post-op Pain Management: Regional block* and Tylenol PO (pre-op)*   Induction: Intravenous  PONV Risk Score and Plan: 3 and Midazolam, Dexamethasone and Ondansetron  Airway Management Planned: Oral ETT  Additional Equipment:   Intra-op Plan:   Post-operative Plan: Extubation in OR  Informed Consent: I have reviewed the patients History and Physical, chart, labs and discussed the procedure including the risks, benefits and alternatives for the proposed anesthesia with the patient or authorized representative who has indicated his/her understanding and acceptance.     Dental advisory given  Plan Discussed with: CRNA  Anesthesia Plan Comments:        Anesthesia Quick Evaluation

## 2023-03-24 NOTE — Interval H&P Note (Signed)
All questions answered, patient wants to proceed with procedure. ? ?

## 2023-03-24 NOTE — Anesthesia Procedure Notes (Signed)
Anesthesia Regional Block: Interscalene brachial plexus block   Pre-Anesthetic Checklist: , timeout performed,  Correct Patient, Correct Site, Correct Laterality,  Correct Procedure, Correct Position, site marked,  Risks and benefits discussed,  Pre-op evaluation,  At surgeon's request and post-op pain management  Laterality: Right  Prep: Maximum Sterile Barrier Precautions used, chloraprep       Needles:  Injection technique: Single-shot  Needle Type: Echogenic Stimulator Needle     Needle Length: 9cm  Needle Gauge: 21     Additional Needles:   Procedures:,,,, ultrasound used (permanent image in chart),,    Narrative:  Start time: 03/24/2023 7:38 AM End time: 03/24/2023 7:42 AM Injection made incrementally with aspirations every 5 mL. Anesthesiologist: Elmer Picker, MD

## 2023-03-24 NOTE — Op Note (Signed)
Orthopaedic Surgery Operative Note (CSN: [id number])  Eileen Arnold  [birth year] Date of Surgery: 03/24/2023   Diagnoses:  Right massive irreparable cuff tear  Procedure: Right reverse lateralized  Total Shoulder Arthroplasty   Operative Finding Successful completion of planned procedure.  Patient had no subscapularis tissue really still attached but we were able to repair a small amount of blood at the end of surgery.  Superior cuff was retracted be repaired.  Her body habitus made our tensioning somewhat difficult however we were happy with our bone quality and the eventual tensioning.  Post-operative plan: The patient will be NWB in sling.  The patient will be will be discharged from PACU if continues to be stable as was plan prior to surgery.  DVT prophylaxis Aspirin 81 mg twice daily for 6 weeks.  Pain control with PRN pain medication preferring oral medicines.  Follow up plan will be scheduled in approximately 7 days for incision check and XR.  Physical therapy to start immediately.  Implants: Tornier perform humeral size 2+ stem, +3 retentive polyethylene, 36+3 glenosphere with a 25+3 baseplate and a 40 center screw with 2 peripheral locking screws  Post-Op Diagnosis: Same Surgeons:Primary: Bjorn Pippin, MD Assistants:Caroline McBane PA-C Location: [facility] OR ROOM 6 Anesthesia: General with Exparel Interscalene Antibiotics: Ancef 2g preop, Vancomycin 1000mg  locally Tourniquet time: None Estimated Blood Loss: 100 Complications: None Specimens: None Implants: Implant Name Type Inv. Item Serial No. Manufacturer Lot No. LRB No. Used Action  BSPLAT GLND +3 25 - [id number] Shoulder BSPLAT GLND +3 25 [id number] [employer]  Right 1 Implanted  SPHERE GLENOID LAT REV 36 - [id number] Joint SPHERE GLENOID LAT REV 36 [id number] [employer]  Right 1 Implanted  SCREW BONE 6.5X40 SM - [id number] Screw SCREW BONE 6.5X40 SM  [employer]  Right 1 Implanted  SCREW 5.5X26 - [id number] Screw  SCREW 5.5X26  [employer]  Right 1 Implanted  SCREW 5.5X22 - [id number] Screw SCREW 5.5X22  [employer]  Right 1 Implanted  CUP HUM RET INSRT SZ 1/2 36 +3 - [id number] Joint CUP HUM RET INSRT SZ 1/2 36 +3 [id number] [employer]  Right 1 Implanted  STEM HUMERAL PLUS SHORT SZ2+ - [id number] Orthopedic Implant STEM HUMERAL PLUS SHORT SZ2+ [id number] [employer]  Right 1 Implanted    Indications for Surgery:   Eileen Arnold is a 62 y.o. female with irreparable rotator cuff tear.  Benefits and risks of operative and nonoperative management were discussed prior to surgery with patient/guardian(s) and informed consent form was completed.  Infection and need for further surgery were discussed as was prosthetic stability and cuff issues.  We additionally specifically discussed risks of axillary nerve injury, infection, periprosthetic fracture, continued pain and longevity of implants prior to beginning procedure.      Procedure:   The patient was identified in the preoperative holding area where the surgical site was marked. Block placed by anesthesia with exparel.  The patient was taken to the OR where a procedural timeout was called and the above noted anesthesia was induced.  The patient was positioned beachchair on allen table with spider arm positioner.  Preoperative antibiotics were dosed.  The patient's right shoulder was prepped and draped in the usual sterile fashion.  A second preoperative timeout was called.       Standard deltopectoral approach was performed with a #10 blade. We dissected down to the subcutaneous tissues and the cephalic vein was taken laterally with the deltoid. Clavipectoral fascia was incised in line  with the incision. Deep retractors were placed. The long of the biceps tendon was identified and there was significant tenosynovitis present.  Tenodesis was performed to the pectoralis tendon with #2 Ethibond. The remaining biceps was followed up into the rotator interval where it  was released.   The subscapularis was taken down in a full thickness layer with capsule along the humeral neck extending inferiorly around the humeral head. We continued releasing the capsule directly off of the osteophytes inferiorly all the way around the corner. This allowed [place] to dislocate the humeral head.   The rotator cuff was carefully examined and noted to be irreperably torn.  The decision was confirmed that a reverse total shoulder was indicated for this patient.  There were osteophytes along the inferior humeral neck. The osteophytes were removed with an osteotome and a rongeur.  Osteophytes were removed with a rongeur and an osteotome and the anatomic neck was well visualized.     A humeral cutting guide was used extra medullary with a pin to help control version. The version was set at 20 of retroversion. Humeral osteotomy was performed with an oscillating saw. The head fragment was passed off the back table.  A cut protector plate was placed.  The subscapularis was again identified and immediately we took care to palpate the axillary nerve anteriorly and verify its position with gentle palpation as well as the tug test.  We then released the SGHL with bovie cautery prior to placing a curved mayo at the junction of the anterior glenoid well above the axillary nerve and bluntly dissecting the subscapularis from the capsule.  We then carefully protected the axillary nerve as we gently released the inferior capsule to fully mobilize the subscapularis.  An anterior deltoid retractor was then placed as well as a small Hohmann retractor superiorly.   The glenoid was relatively intact in the setting of an irreparable cuff tear  The remaining labrum was removed circumferentially taking great care not to disrupt the posterior capsule.   The glenoid drill guide was placed and used to drill a guide pin in the center, inferior position. The glenoid face was then reamed concentrically over the  guide wire. The center hole was drilled over the guidepin in a near anatomic angle of version. Next the glenoid vault was drilled back to a depth of 40 mm.  We tapped and then placed a 25mm size baseplate with additional 3mm lateralization was selected with a 6.5 mm x 40 mm length central screw.  The base plate was screwed into the glenoid vault obtaining secure fixation. We next placed superior and inferior locking screws for additional fixation.  Next a 36+3 mm glenosphere was selected and impacted onto the baseplate. The center screw was tightened.  We turned attention back to the humeral side. The cut protector was removed.  We used the perform humeral sizing block to select the appropriate size which for this patient was a 2.  We then placed our center pin and reamed over it concentrically obtaining appropriate inset.  We then used our lateralizing chisel to prepare the lateral aspect of the humerus.  At that point we selected the appropriate implant trialing a 2+.  Using this trial implant we trialed multiple polyethylene sizes settling on a 3 retentive which provided good stability and range of motion without excess soft tissue tension. The offset was dialed in to match the normal anatomy. The shoulder was trialed.  There was good ROM in all planes and the  shoulder was stable with no inferior translation.  The real humeral implants were opened after again confirming sizes.  The trial was removed. #5 Fiberwire x4 sutures passed through the humeral neck for subscap repair. The humeral component was press-fit obtaining a secure fit. The joint was reduced and thoroughly irrigated with pulsatile lavage. Subscap was repaired back with #5 Fiberwire sutures through bone tunnels. Hemostasis was obtained. The deltopectoral interval was reapproximated with #1 Ethibond. The subcutaneous tissues were closed with 2-0 Vicryl and the skin was closed with running monocryl.    The wounds were cleaned and dried and an  Aquacel dressing was placed. The drapes taken down. The arm was placed into sling with abduction pillow. Patient was awakened, extubated, and transferred to the recovery room in stable condition. There were no intraoperative complications. The sponge, needle, and attention counts were  correct at the end of the case.     Alfonse Alpers, PA-C, present and scrubbed throughout the case, critical for completion in a timely fashion, and for retraction, instrumentation, closure.

## 2023-03-24 NOTE — Progress Notes (Signed)
Assisted Dr. Armond Hang with right, interscalene , ultrasound guided block. Side rails up, monitors on throughout procedure. See vital signs in flow sheet. Tolerated Procedure well.

## 2023-03-24 NOTE — Transfer of Care (Signed)
Immediate Anesthesia Transfer of Care Note  Patient: Eileen Arnold  Procedure(s) Performed: REVERSE SHOULDER ARTHROPLASTY (Right: Shoulder)  Patient Location: PACU  Anesthesia Type:General  Level of Consciousness: awake, alert , and patient cooperative  Airway & Oxygen Therapy: Patient Spontanous Breathing and Patient connected to face mask oxygen  Post-op Assessment: Report given to RN and Post -op Vital signs reviewed and stable  Post vital signs: Reviewed and stable  Last Vitals:  Vitals Value Taken Time  BP 157/87 03/24/23 0930  Temp    Pulse 86 03/24/23 0931  Resp 14 03/24/23 0931  SpO2 96 % 03/24/23 0931  Vitals shown include unfiled device data.  Last Pain:  Vitals:   03/24/23 0640  TempSrc: Oral  PainSc: 9       Patients Stated Pain Goal: 4 (03/24/23 0640)  Complications: No notable events documented.

## 2023-03-25 ENCOUNTER — Encounter (HOSPITAL_BASED_OUTPATIENT_CLINIC_OR_DEPARTMENT_OTHER): Payer: Self-pay | Admitting: Orthopaedic Surgery

## 2023-04-11 ENCOUNTER — Other Ambulatory Visit: Payer: Self-pay | Admitting: Nurse Practitioner

## 2023-04-11 DIAGNOSIS — N3281 Overactive bladder: Secondary | ICD-10-CM

## 2023-04-11 NOTE — Telephone Encounter (Signed)
Pt called regarding this refill request. Pt says she just saw MMM a month ago and her meds were discussed and MMM said that she would send in refills for her, but did not. Needs refills sent in to [facility] in [place].

## 2023-04-13 ENCOUNTER — Ambulatory Visit: Payer: 59

## 2023-04-13 ENCOUNTER — Encounter: Payer: Self-pay | Admitting: Family Medicine

## 2023-04-13 VITALS — BP 133/73 | HR 86 | Temp 98.3°F | Ht 63.0 in | Wt 289.4 lb

## 2023-04-13 DIAGNOSIS — L03115 Cellulitis of right lower limb: Secondary | ICD-10-CM

## 2023-04-13 DIAGNOSIS — N3281 Overactive bladder: Secondary | ICD-10-CM

## 2023-04-13 DIAGNOSIS — R600 Localized edema: Secondary | ICD-10-CM

## 2023-04-13 DIAGNOSIS — S81812D Laceration without foreign body, left lower leg, subsequent encounter: Secondary | ICD-10-CM

## 2023-04-13 DIAGNOSIS — L03116 Cellulitis of left lower limb: Secondary | ICD-10-CM | POA: Diagnosis not present

## 2023-04-13 MED ORDER — POTASSIUM CHLORIDE CRYS ER 10 MEQ PO TBCR
10.0000 meq | EXTENDED_RELEASE_TABLET | Freq: Every day | ORAL | 0 refills | Status: AC
Start: 2023-04-13 — End: ?

## 2023-04-13 MED ORDER — SULFAMETHOXAZOLE-TRIMETHOPRIM 800-160 MG PO TABS
1.0000 | ORAL_TABLET | Freq: Two times a day (BID) | ORAL | 0 refills | Status: AC
Start: 2023-04-13 — End: 2023-04-20

## 2023-04-13 MED ORDER — BIOGUARD NON-ADHERENT DRESSING 3"X4" PADS
MEDICATED_PAD | 0 refills | Status: AC
Start: 2023-04-13 — End: ?

## 2023-04-13 MED ORDER — FUROSEMIDE 20 MG PO TABS
20.0000 mg | ORAL_TABLET | Freq: Every day | ORAL | 0 refills | Status: AC
Start: 2023-04-13 — End: ?

## 2023-04-13 MED ORDER — SOLIFENACIN SUCCINATE 5 MG PO TABS: 5.0000 mg | ORAL_TABLET | Freq: Every day | ORAL | 1 refills | Status: AC

## 2023-04-13 NOTE — Patient Instructions (Signed)

## 2023-04-13 NOTE — Progress Notes (Signed)
Acute Office Visit  Subjective:     Patient ID: Eileen Arnold, female    DOB: 1960-12-12, 62 y.o.   MRN: [id number]  Chief Complaint  Patient presents with   peripheral edema    HPI Patient is in today for swelling of her leg and feet. This has been ongoing for months, worsening since a fall 10 days ago. She fell while at [facility] and has a wound to her left lower leg from the fall. She also bumped her right lower leg on her car door about a month ago. There is purulent exudate from the wound on her left lower leg. Both legs are also red and tender. No fever or chills. She was seen yesterday at urgent care and prescribed bactrim and mupirocin ointment. She has been keep her left leg dressed and wrapped since yesterday. She will going on a cruise in 5 days.    Also needs refill on vesicare. Saw PCP recently for follow up regarding this.   ROS As per HPI.      Objective:    BP 133/73   Pulse 86   Temp 98.3 F (36.8 C) (Temporal)   Ht 5\' 3"  (1.6 m)   Wt 289 lb 6 oz (131.3 kg)   SpO2 94%   BMI 51.26 kg/m    Physical Exam Vitals and nursing note reviewed.  Constitutional:      General: She is not in acute distress.    Appearance: She is obese. She is not ill-appearing, toxic-appearing or diaphoretic.  Cardiovascular:     Rate and Rhythm: Normal rate and regular rhythm.     Heart sounds: Normal heart sounds. No murmur heard. Pulmonary:     Effort: Pulmonary effort is normal. No respiratory distress.  Musculoskeletal:     Right lower leg: Swelling and tenderness present. No bony tenderness. 1+ Pitting Edema present.     Left lower leg: Swelling and tenderness present. No bony tenderness. 1+ Pitting Edema present.     Right ankle: Swelling present.     Left ankle: Swelling present.  Skin:    General: Skin is warm and dry.     Findings: Wound (see picture below) present.  Neurological:     General: No focal deficit present.     Mental Status: She is alert and  oriented to person, place, and time.  Psychiatric:        Mood and Affect: Mood normal.        Behavior: Behavior normal.           Assessment & Plan:   Joleah was seen today for peripheral edema.  Diagnoses and all orders for this visit:  Bilateral cellulitis of lower leg Laceration of left lower leg Peripheral edema Edema has been chronic, worsening since wound. Started on bactrim yesterday by UC. Wound cleansed with saline and dressed with nonadherent dressing today. Will extend bactrim out to 14 days as she will on a cruise after the weekend. Lasix daily with potassium supplement. Discussed elevation, wound care, and return precautions. Will have her follow up the Monday after she return for her cruise.  -     sulfamethoxazole-trimethoprim (BACTRIM DS) 800-160 MG tablet; Take 1 tablet by mouth 2 (two) times daily for 7 days. Take in addition to previous prescription for total of 14 days. -     furosemide (LASIX) 20 MG tablet; Take 1 tablet (20 mg total) by mouth daily. -     potassium chloride (KLOR-CON M) 10  MEQ tablet; Take 1 tablet (10 mEq total) by mouth daily. -     Gauze Pads & Dressings (BIOGUARD NON-ADHERENT DRESSING) 3"X4" PADS; Apply to wound daily as needed.  Overactive bladder Refill provided.  -     solifenacin (VESICARE) 5 MG tablet; Take 1 tablet (5 mg total) by mouth daily.  Return in about 12 days (around 04/25/2023) for cellulitis follow up and BMP.  The patient indicates understanding of these issues and agrees with the plan.  Gabriel Earing, FNP

## 2023-04-18 ENCOUNTER — Encounter (INDEPENDENT_AMBULATORY_CARE_PROVIDER_SITE_OTHER): Payer: Self-pay | Admitting: *Deleted

## 2023-04-25 ENCOUNTER — Ambulatory Visit: Payer: 59 | Admitting: Nurse Practitioner

## 2023-04-25 ENCOUNTER — Encounter: Payer: Self-pay | Admitting: Nurse Practitioner

## 2023-04-25 VITALS — BP 138/85 | HR 87 | Temp 97.5°F | Ht 63.0 in | Wt 285.0 lb

## 2023-04-25 DIAGNOSIS — S81812D Laceration without foreign body, left lower leg, subsequent encounter: Secondary | ICD-10-CM | POA: Diagnosis not present

## 2023-04-25 DIAGNOSIS — M25562 Pain in left knee: Secondary | ICD-10-CM

## 2023-04-25 MED ORDER — BUPIVACAINE HCL 0.25 % IJ SOLN
1.0000 mL | Freq: Once | INTRAMUSCULAR | Status: AC
Start: 2023-04-25 — End: 2023-04-25
  Administered 2023-04-25: 1 mL via INTRA_ARTICULAR

## 2023-04-25 MED ORDER — METHYLPREDNISOLONE ACETATE 40 MG/ML IJ SUSP
40.0000 mg | Freq: Once | INTRAMUSCULAR | Status: AC
Start: 2023-04-25 — End: 2023-04-25
  Administered 2023-04-25: 40 mg via INTRAMUSCULAR

## 2023-04-25 NOTE — Patient Instructions (Signed)
Joint Steroid Injection A joint steroid injection is a procedure to relieve swelling and pain in a joint. Steroids are medicines that reduce inflammation. In this procedure, your health care provider uses a syringe and a needle to inject a steroid medicine into a painful and inflamed joint. A pain-relieving medicine (anesthetic) may be injected along with the steroid. In some cases, your health care provider may use an imaging technique such as ultrasound or fluoroscopy to guide the injection. Joints that are often treated with steroid injections include the knee, shoulder, hip, and spine. These injections may also be used in the elbow, ankle, and joints of the hands or feet. You may have joint steroid injections as part of your treatment for inflammation caused by: Gout. Rheumatoid arthritis. Advanced wear-and-tear arthritis (osteoarthritis). Tendinitis. Bursitis. Joint steroid injections may be repeated, but having them too often can damage a joint or the skin over the joint. You should not have joint steroid injections less than 6 weeks apart or more than four times a year. Tell a health care provider about: Any allergies you have. All medicines you are taking, including vitamins, herbs, eye drops, creams, and over-the-counter medicines. Any problems you or family members have had with anesthetic medicines. Any blood disorders you have. Any surgeries you have had. Any medical conditions you have. Whether you are pregnant or may be pregnant. What are the risks? Generally, this is a safe treatment. However, problems may occur, including: Infection. Bleeding. Allergic reactions to medicines. Damage to the joint or tissues around the joint. Thinning of skin or loss of skin color over the joint. Temporary flushing of the face or chest. Temporary increase in pain. Temporary increase in blood sugar. Failure to relieve inflammation or pain. What happens before the treatment? Medicines Ask  your health care provider about: Changing or stopping your regular medicines. This is especially important if you are taking diabetes medicines or blood thinners. Taking medicines such as aspirin and ibuprofen. These medicines can thin your blood. Do not take these medicines unless your health care provider tells you to take them. Taking over-the-counter medicines, vitamins, herbs, and supplements. General instructions You may have imaging tests of your joint. Ask your health care provider if you can drive yourself home after the procedure. What happens during the treatment?  Your health care provider will position you for the injection and locate the injection site over your joint. The skin over the joint will be cleaned with a germ-killing soap. Your health care provider may: Spray a numbing solution (topical anesthetic) over the injection site. Inject a local anesthetic under the skin above your joint. The needle will be placed through your skin into your joint. Your health care provider may use imaging to guide the needle to the right spot for the injection. If imaging is used, a special contrast dye may be injected to confirm that the needle is in the correct location. The steroid medicine will be injected into your joint. Anesthetic may be injected along with the steroid. This may be a medicine that relieves pain for a short time (short-acting anesthetic) or for a longer time (long-acting anesthetic). The needle will be removed, and an adhesive bandage (dressing) will be placed over the injection site. The procedure may vary among health care providers and hospitals. What can I expect after the treatment? You will be able to go home after the treatment. It is normal to feel slight flushing for a few days after the injection. After the treatment, it is  common to have an increase in joint pain after the anesthetic has worn off. This may happen about an hour after a short-acting anesthetic  or about 8 hours after a longer-acting anesthetic. You should begin to feel relief from joint pain and swelling after 24 to 48 hours. Contact your health care provider if you do not begin to feel relief after 2 days. Follow these instructions at home: Injection site care Leave the adhesive dressing over your injection site in place until your health care provider says you can remove it. Check your injection site every day for signs of infection. Check for: More redness, swelling, or pain. Fluid or blood. Warmth. Pus or a bad smell. Activity Return to your normal activities as told by your health care provider. Ask your health care provider what activities are safe for you. You may be asked to limit activities that put stress on the joint for a few days. Do joint exercises as told by your health care provider. Do not take baths, swim, or use a hot tub until your health care provider approves. Ask your health care provider if you may take showers. You may only be allowed to take sponge baths. Managing pain, stiffness, and swelling  If directed, put ice on the joint. To do this: Put ice in a plastic bag. Place a towel between your skin and the bag. Leave the ice on for 20 minutes, 2-3 times a day. Remove the ice if your skin turns bright red. This is very important. If you cannot feel pain, heat, or cold, you have a greater risk of damage to the area. Raise (elevate) your joint above the level of your heart when you are sitting or lying down. General instructions Take over-the-counter and prescription medicines only as told by your health care provider. Do not use any products that contain nicotine or tobacco, such as cigarettes, e-cigarettes, and chewing tobacco. These can delay joint healing. If you need help quitting, ask your health care provider. If you have diabetes, be aware that your blood sugar may be slightly elevated for several days after the injection. Keep all follow-up visits.  This is important. Contact a health care provider if you have: Chills or a fever. Any signs of infection at your injection site. Increased pain or swelling or no relief after 2 days. Summary A joint steroid injection is a treatment to relieve pain and swelling in a joint. Steroids are medicines that reduce inflammation. Your health care provider may add an anesthetic along with the steroid. You may have joint steroid injections as part of your arthritis treatment. Joint steroid injections may be repeated, but having them too often can damage a joint or the skin over the joint. Contact your health care provider if you have a fever, chills, or signs of infection, or if you get no relief from joint pain or swelling. This information is not intended to replace advice given to you by your health care provider. Make sure you discuss any questions you have with your health care provider. Document Revised: 11/23/2019 Document Reviewed: 11/23/2019 Elsevier Patient Education  2024 [employer].

## 2023-04-25 NOTE — Progress Notes (Signed)
Subjective:    Patient ID: Eileen Arnold, female    DOB: 08/22/1960, 62 y.o.   MRN: [id number]   Chief Complaint: cellulitis and knee pain  HPI  Patient was seen 04/13/23 with left leg swelling and cellulitis following a fall 10 days prior. She was seen by urgent care and started on bactrim. The bactrim was extended here for 14 days. Wound was cleaned and dressed. Here today for recheck of leg wound, left lower leg.  Left knee pain- started when she fell several months ago. Pain has continued. Rates 7-8/10. Pain is worse when standing or walking long distances. Tylenol not helping.  Patient Active Problem List   Diagnosis Date Noted   Rectal bleeding 05/02/2018   History of colonic polyps 05/02/2018   Osteoarthritis 10/25/2016   Overactive bladder 10/25/2016   Morbid obesity with BMI of 45.0-49.9, adult (HCC) 10/25/2016   Family hx of colon cancer 10/29/2015       Review of Systems  Constitutional:  Negative for diaphoresis.  Eyes:  Negative for pain.  Respiratory:  Negative for shortness of breath.   Cardiovascular:  Negative for chest pain, palpitations and leg swelling.  Gastrointestinal:  Negative for abdominal pain.  Endocrine: Negative for polydipsia.  Skin:  Negative for rash.  Neurological:  Negative for dizziness, weakness and headaches.  Hematological:  Does not bruise/bleed easily.  All other systems reviewed and are negative.      Objective:   Physical Exam Vitals reviewed.  Constitutional:      Appearance: Normal appearance.  Cardiovascular:     Rate and Rhythm: Normal rate and regular rhythm.     Heart sounds: Normal heart sounds.  Pulmonary:     Effort: Pulmonary effort is normal.     Breath sounds: Normal breath sounds.  Musculoskeletal:     Comments: FROM of left knee wit pain on full extension Medial pain on palpation Mild  effusion noted  Skin:    General: Skin is warm.     Comments: Healing wound with eschar- see photo   Neurological:     General: No focal deficit present.     Mental Status: She is alert and oriented to person, place, and time.  Psychiatric:        Mood and Affect: Mood normal.        Behavior: Behavior normal.     BP 138/85   Pulse 87   Temp (!) 97.5 F (36.4 C) (Skin)   Ht 5\' 3"  (1.6 m)   Wt 285 lb (129.3 kg)   SpO2 95%   BMI 50.49 kg/m   Joint Injection/Arthrocentesis  Date/Time: 04/25/2023 12:54 PM  Performed by: Bennie Pierini, FNP Authorized by: Daphine Deutscher Mary-Margaret, FNP  Indications: pain  Body area: knee Joint: left knee Local anesthesia used: no  Anesthesia: Local anesthesia used: no  Sedation: Patient sedated: no  Preparation: Patient was prepped and draped in the usual sterile fashion. Needle size: 22 G Ultrasound guidance: no Approach: medial Methylprednisolone amount: 40 mg Bupivacaine 0.25% amount: 1 mL Patient tolerance: patient tolerated the procedure well with no immediate complications            Assessment & Plan:  Eileen Arnold in today with chief complaint of Knee Pain (Left/) and Recheck cellulitis   1. Laceration of left lower leg, subsequent encounter Keep clean and dry Don't pick at area RTO prn  2. Knee pain left Joint injection Ice bid RTOprn   The above assessment and  management plan was discussed with the patient. The patient verbalized understanding of and has agreed to the management plan. Patient is aware to call the clinic if symptoms persist or worsen. Patient is aware when to return to the clinic for a follow-up visit. Patient educated on when it is appropriate to go to the emergency department.   Mary-Margaret Daphine Deutscher, FNP

## 2023-04-27 ENCOUNTER — Telehealth: Payer: Self-pay | Admitting: Nurse Practitioner

## 2023-04-27 NOTE — Telephone Encounter (Signed)
Patient aware and verbalizes understanding. 

## 2023-04-27 NOTE — Telephone Encounter (Signed)
Pt called to let MMM know that she has thought about it and has decided that she does want MMM to prescribe her Wegovy.

## 2023-04-27 NOTE — Telephone Encounter (Signed)
Pharmacy don't have Wegovy and it's on long term and she can get Mounjaro or Ozempic. Needs knee replacement and needs to lose weight in order to have surgery.

## 2023-04-27 NOTE — Telephone Encounter (Signed)
Sorry but can't do ozempic or mounjario- ins will not cover those if not a diabetic

## 2023-05-02 ENCOUNTER — Telehealth: Payer: Self-pay | Admitting: Nurse Practitioner

## 2023-05-02 NOTE — Telephone Encounter (Signed)
Pt has been notified.

## 2023-05-02 NOTE — Telephone Encounter (Signed)
Message already in 

## 2023-05-02 NOTE — Telephone Encounter (Signed)
Patient calls and states the pharmacy told her that she could get a coupon online for [facility] and her cost will be $25.00 a month.

## 2023-05-02 NOTE — Telephone Encounter (Signed)
You have to be a diabetic in order to be prescribed mounjario

## 2023-05-17 ENCOUNTER — Ambulatory Visit (HOSPITAL_COMMUNITY)
Admission: RE | Admit: 2023-05-17 | Discharge: 2023-05-17 | Disposition: A | Discharge status: Home / Self Care | Payer: 59 | Source: Ambulatory Visit | Attending: Cardiology | Admitting: Cardiology

## 2023-05-17 ENCOUNTER — Other Ambulatory Visit (HOSPITAL_COMMUNITY): Payer: Self-pay | Admitting: Medical

## 2023-05-17 DIAGNOSIS — M7989 Other specified soft tissue disorders: Secondary | ICD-10-CM | POA: Insufficient documentation

## 2023-05-17 DIAGNOSIS — M79662 Pain in left lower leg: Secondary | ICD-10-CM | POA: Insufficient documentation
